# Patient Record
Sex: Male | Born: 1955 | Hispanic: No | Marital: Single | Smoking: Former smoker
Health system: Southern US, Community
[De-identification: ages and names within clinical notes are randomized; demographics above are authoritative.]

## PROBLEM LIST (undated history)

## (undated) DIAGNOSIS — H3530 Unspecified macular degeneration: Secondary | ICD-10-CM

## (undated) HISTORY — PX: HERNIA REPAIR: SHX51

---

## 2004-02-17 ENCOUNTER — Ambulatory Visit: Payer: Self-pay | Admitting: Internal Medicine

## 2004-02-27 ENCOUNTER — Ambulatory Visit: Payer: Self-pay | Admitting: Internal Medicine

## 2010-05-28 ENCOUNTER — Other Ambulatory Visit: Payer: Self-pay | Admitting: Internal Medicine

## 2010-05-28 ENCOUNTER — Encounter: Payer: Self-pay | Admitting: Internal Medicine

## 2010-05-28 ENCOUNTER — Ambulatory Visit (INDEPENDENT_AMBULATORY_CARE_PROVIDER_SITE_OTHER): Payer: Self-pay | Admitting: Internal Medicine

## 2010-05-28 ENCOUNTER — Other Ambulatory Visit: Payer: Self-pay

## 2010-05-28 DIAGNOSIS — Z0000 Encounter for general adult medical examination without abnormal findings: Secondary | ICD-10-CM

## 2010-05-28 DIAGNOSIS — J309 Allergic rhinitis, unspecified: Secondary | ICD-10-CM | POA: Insufficient documentation

## 2010-05-28 LAB — LIPID PANEL
Total CHOL/HDL Ratio: 2
VLDL: 10.2 mg/dL (ref 0.0–40.0)

## 2010-05-28 LAB — BASIC METABOLIC PANEL
BUN: 16 mg/dL (ref 6–23)
GFR: 76.81 mL/min (ref >60.00)
Glucose, Bld: 81 mg/dL (ref 70–99)
Potassium: 3.7 mEq/L (ref 3.5–5.1)

## 2010-05-31 ENCOUNTER — Ambulatory Visit: Payer: Self-pay | Admitting: Internal Medicine

## 2010-06-07 NOTE — Assessment & Plan Note (Signed)
Summary: new pt/cpx/per Dr. Yetta Barre no charge for exam/lb   Vital Signs:  Patient profile:   55 year old male Height:      74 inches Weight:      184 pounds BMI:     23.71 O2 Sat:      99 % on Room air Temp:     97.5 degrees F oral Pulse rate:   75 / minute Pulse rhythm:   regular Resp:     16 per minute BP sitting:   128 / 78  (left arm) Cuff size:   regular  Vitals Entered By: Bill Salinas CMA (May 28, 2010 3:01 PM)  O2 Flow:  Room air CC: New pt here to est care with primary/ ab, Preventive Care Is Patient Diabetic? No Pain Assessment Patient in pain? no        Primary Care Provider:  Yetta Barre  CC:  New pt here to est care with primary/ ab and Preventive Care.  History of Present Illness: New to me for a complete physical. He feels well and offers no complaints.  Preventive Screening-Counseling & Management  Alcohol-Tobacco     Alcohol drinks/day: <1     Alcohol type: spirits     >5/day in last 3 mos: no     Alcohol Counseling: not indicated; use of alcohol is not excessive or problematic     Feels need to cut down: no     Feels annoyed by complaints: no     Feels guilty re: drinking: no     Needs 'eye opener' in am: no     Smoking Status: quit > 6 months     Smoking Cessation Counseling: no     Smoke Cessation Stage: quit     Packs/Day: 0.25     Year Started: 1995     Year Quit: 2011     Pack years: 3     Tobacco Counseling: to remain off tobacco products  Hep-HIV-STD-Contraception     Hepatitis Risk: no risk noted     HIV Risk: no risk noted     STD Risk: no risk noted     Dental Visit-last 6 months yes     Dental Care Counseling: to seek dental care; no dental care within six months     TSE monthly: yes     Testicular SE Education/Counseling to perform regular STE     Sun Exposure-Excessive: no     Sun Exposure Counseling: to decrease sun exposure  Safety-Violence-Falls     Seat Belt Use: no     Helmet Use: n/a     Firearms in the Home: no  firearms in the home     Smoke Detectors: yes     Violence in the Home: no risk noted     Sexual Abuse: no      Sexual History:  not active.        Drug Use:  never.        Blood Transfusions:  no.    Current Medications (verified): 1)  None  Allergies (verified): No Known Drug Allergies  Past History:  Past Medical History: Allergic rhinitis  Family History: Family History of Dementia Family History Hypertension  Social History: Occupation: aesthestician Single Former Smoker Alcohol use-yes Regular exercise-yes  Smoking Status:  quit > 6 months Hepatitis Risk:  no risk noted HIV Risk:  no risk noted STD Risk:  no risk noted Drug Use:  never Blood Transfusions:  no Sexual History:  not active  Packs/Day:  0.25 Dental Care w/in 6 mos.:  yes Sun Exposure-Excessive:  no Seat Belt Use:  no  Review of Systems  The patient denies anorexia, fever, weight loss, weight gain, chest pain, syncope, dyspnea on exertion, peripheral edema, prolonged cough, headaches, hemoptysis, abdominal pain, melena, hematochezia, severe indigestion/heartburn, hematuria, muscle weakness, suspicious skin lesions, transient blindness, difficulty walking, depression, unusual weight change, abnormal bleeding, enlarged lymph nodes, angioedema, and testicular masses.   General:  Denies chills, fatigue, fever, loss of appetite, malaise, sleep disorder, sweats, weakness, and weight loss. GU:  Denies decreased libido, discharge, dysuria, erectile dysfunction, genital sores, hematuria, incontinence, nocturia, urinary frequency, and urinary hesitancy. Derm:  Denies dryness, itching, lesion(s), poor wound healing, and rash.  Physical Exam  General:  alert, well-developed, well-nourished, well-hydrated, appropriate dress, normal appearance, healthy-appearing, cooperative to examination, and good hygiene.   Head:  normocephalic, atraumatic, no abnormalities observed, no abnormalities palpated, and no  alopecia.   Eyes:  vision grossly intact, pupils equal, pupils round, and pupils reactive to light.   Ears:  R ear normal and L ear normal.   Nose:  External nasal examination shows no deformity or inflammation. Nasal mucosa are pink and moist without lesions or exudates. Mouth:  Oral mucosa and oropharynx without lesions or exudates.  Teeth in good repair. Neck:  supple, full ROM, no masses, no thyromegaly, no thyroid nodules or tenderness, no JVD, no HJR, normal carotid upstroke, no carotid bruits, and no cervical lymphadenopathy.   Chest Wall:  no deformities, no tenderness, and no masses.   Breasts:  No masses or gynecomastia noted Lungs:  Normal respiratory effort, chest expands symmetrically. Lungs are clear to auscultation, no crackles or wheezes. Heart:  Normal rate and regular rhythm. S1 and S2 normal without gallop, murmur, click, rub or other extra sounds. Abdomen:  Bowel sounds positive,abdomen soft and non-tender without masses, organomegaly or hernias noted. Rectal:  no external abnormalities, no hemorrhoids, normal sphincter tone, no masses, no tenderness, no fissures, no fistulae, and no perianal rash.   Genitalia:  circumcised, no hydrocele, no varicocele, no scrotal masses, no testicular masses or atrophy, no cutaneous lesions, and no urethral discharge.   Prostate:  no gland enlargement, no nodules, no asymmetry, and no induration.   Msk:  No deformity or scoliosis noted of thoracic or lumbar spine.   Pulses:  R and L carotid,radial,femoral,dorsalis pedis and posterior tibial pulses are full and equal bilaterally Extremities:  No clubbing, cyanosis, edema, or deformity noted with normal full range of motion of all joints.   Neurologic:  No cranial nerve deficits noted. Station and gait are normal. Plantar reflexes are down-going bilaterally. DTRs are symmetrical throughout. Sensory, motor and coordinative functions appear intact. Skin:  turgor normal, color normal, no rashes, no  suspicious lesions, no ecchymoses, no petechiae, no purpura, no ulcerations, no edema. Cervical Nodes:  no anterior cervical adenopathy and no posterior cervical adenopathy.   Axillary Nodes:  no R axillary adenopathy and no L axillary adenopathy.   Inguinal Nodes:  no R inguinal adenopathy and no L inguinal adenopathy.   Psych:  Cognition and judgment appear intact. Alert and cooperative with normal attention span and concentration. No apparent delusions, illusions, hallucinations   Impression & Recommendations:  Problem # 1:  ROUTINE GENERAL MEDICAL EXAM@HEALTH  CARE FACL (ICD-V70.0)  Orders: Venipuncture (78295) TLB-BMP (Basic Metabolic Panel-BMET) (80048-METABOL) TLB-Lipid Panel (80061-LIPID) No Charge Patient Arrived (NCPA0) (NCPA0)  Td Booster: Historical (11/15/2002)    Discussed using sunscreen, use of alcohol, drug use,  self testicular exam, routine dental care, routine eye care, routine physical exam, seat belts, multiple vitamins,  and recommendations for immunizations.  Discussed exercise and checking cholesterol.   Problem # 2:  ALLERGIC RHINITIS (ICD-477.9) Assessment: Improved  The following medications were removed from the medication list:    Fluticasone Propionate 50 Mcg/act Susp (Fluticasone propionate) ..... Inhale 1 puff as directed  once a day  Colorectal Screening:  Current Recommendations:    Hemoccult: NEG X 1 today  Immunization & Chemoprophylaxis:    Tetanus vaccine: Historical  (11/15/2002)  Patient Instructions: 1)  It is important that you exercise regularly at least 20 minutes 5 times a week. If you develop chest pain, have severe difficulty breathing, or feel very tired , stop exercising immediately and seek medical attention. 2)  Schedule a colonoscopy/sigmoidoscopy to help detect colon cancer. 3)  Take an Aspirin every day. 4)  Please schedule a follow-up appointment as needed.   Orders Added: 1)  Venipuncture [36415] 2)  TLB-BMP (Basic  Metabolic Panel-BMET) [80048-METABOL] 3)  TLB-Lipid Panel [80061-LIPID] 4)  No Charge Patient Arrived (NCPA0) [NCPA0]

## 2012-12-18 ENCOUNTER — Other Ambulatory Visit (INDEPENDENT_AMBULATORY_CARE_PROVIDER_SITE_OTHER): Payer: BC Managed Care – PPO

## 2012-12-18 ENCOUNTER — Ambulatory Visit (INDEPENDENT_AMBULATORY_CARE_PROVIDER_SITE_OTHER): Payer: BC Managed Care – PPO | Admitting: Internal Medicine

## 2012-12-18 ENCOUNTER — Encounter: Payer: Self-pay | Admitting: Internal Medicine

## 2012-12-18 VITALS — BP 142/90 | HR 76 | Temp 97.5°F | Resp 16 | Ht 73.0 in | Wt 194.0 lb

## 2012-12-18 DIAGNOSIS — Z0000 Encounter for general adult medical examination without abnormal findings: Secondary | ICD-10-CM | POA: Insufficient documentation

## 2012-12-18 DIAGNOSIS — J309 Allergic rhinitis, unspecified: Secondary | ICD-10-CM

## 2012-12-18 LAB — COMPREHENSIVE METABOLIC PANEL
AST: 14 U/L (ref 0–37)
BUN: 21 mg/dL (ref 6–23)
Calcium: 9.4 mg/dL (ref 8.4–10.5)
Chloride: 103 mEq/L (ref 96–112)
Creatinine, Ser: 1.2 mg/dL (ref 0.4–1.5)
GFR: 68.63 mL/min (ref >60.00)

## 2012-12-18 LAB — LIPID PANEL: Cholesterol: 187 mg/dL (ref 0–200)

## 2012-12-18 LAB — URINALYSIS, ROUTINE W REFLEX MICROSCOPIC
Bilirubin Urine: NEGATIVE
Hgb urine dipstick: NEGATIVE
Leukocytes, UA: NEGATIVE
Nitrite: NEGATIVE
pH: 6 (ref 5.0–8.0)

## 2012-12-18 LAB — CBC WITH DIFFERENTIAL/PLATELET
Basophils Relative: 0.4 % (ref 0.0–3.0)
Eosinophils Absolute: 0.1 10*3/uL (ref 0.0–0.7)
HCT: 43 % (ref 39.0–52.0)
Hemoglobin: 14.9 g/dL (ref 13.0–17.0)
Lymphocytes Relative: 31.4 % (ref 12.0–46.0)
MCHC: 34.7 g/dL (ref 30.0–36.0)
MCV: 91.3 fl (ref 78.0–100.0)
Monocytes Absolute: 0.3 10*3/uL (ref 0.1–1.0)
Neutro Abs: 3.1 10*3/uL (ref 1.4–7.7)
RBC: 4.71 Mil/uL (ref 4.22–5.81)

## 2012-12-18 LAB — TSH: TSH: 1.16 u[IU]/mL (ref 0.35–5.50)

## 2012-12-18 NOTE — Patient Instructions (Addendum)
Health Maintenance, Males A healthy lifestyle and preventative care can promote health and wellness.  Maintain regular health, dental, and eye exams.  Eat a healthy diet. Foods like vegetables, fruits, whole grains, low-fat dairy products, and lean protein foods contain the nutrients you need without too many calories. Decrease your intake of foods high in solid fats, added sugars, and salt. Get information about a proper diet from your caregiver, if necessary.  Regular physical exercise is one of the most important things you can do for your health. Most adults should get at least 150 minutes of moderate-intensity exercise (any activity that increases your heart rate and causes you to sweat) each week. In addition, most adults need muscle-strengthening exercises on 2 or more days a week.   Maintain a healthy weight. The body mass index (BMI) is a screening tool to identify possible weight problems. It provides an estimate of body fat based on height and weight. Your caregiver can help determine your BMI, and can help you achieve or maintain a healthy weight. For adults 20 years and older:  A BMI below 18.5 is considered underweight.  A BMI of 18.5 to 24.9 is normal.  A BMI of 25 to 29.9 is considered overweight.  A BMI of 30 and above is considered obese.  Maintain normal blood lipids and cholesterol by exercising and minimizing your intake of saturated fat. Eat a balanced diet with plenty of fruits and vegetables. Blood tests for lipids and cholesterol should begin at age 20 and be repeated every 5 years. If your lipid or cholesterol levels are high, you are over 50, or you are a high risk for heart disease, you may need your cholesterol levels checked more frequently.Ongoing high lipid and cholesterol levels should be treated with medicines, if diet and exercise are not effective.  If you smoke, find out from your caregiver how to quit. If you do not use tobacco, do not start.  If you  choose to drink alcohol, do not exceed 2 drinks per day. One drink is considered to be 12 ounces (355 mL) of beer, 5 ounces (148 mL) of wine, or 1.5 ounces (44 mL) of liquor.  Avoid use of street drugs. Do not share needles with anyone. Ask for help if you need support or instructions about stopping the use of drugs.  High blood pressure causes heart disease and increases the risk of stroke. Blood pressure should be checked at least every 1 to 2 years. Ongoing high blood pressure should be treated with medicines if weight loss and exercise are not effective.  If you are 45 to 55 years old, ask your caregiver if you should take aspirin to prevent heart disease.  Diabetes screening involves taking a blood sample to check your fasting blood sugar level. This should be done once every 3 years, after age 45, if you are within normal weight and without risk factors for diabetes. Testing should be considered at a younger age or be carried out more frequently if you are overweight and have at least 1 risk factor for diabetes.  Colorectal cancer can be detected and often prevented. Most routine colorectal cancer screening begins at the age of 50 and continues through age 75. However, your caregiver may recommend screening at an earlier age if you have risk factors for colon cancer. On a yearly basis, your caregiver may provide home test kits to check for hidden blood in the stool. Use of a small camera at the end of a tube,   to directly examine the colon (sigmoidoscopy or colonoscopy), can detect the earliest forms of colorectal cancer. Talk to your caregiver about this at age 50, when routine screening begins. Direct examination of the colon should be repeated every 5 to 10 years through age 75, unless early forms of pre-cancerous polyps or small growths are found.  Hepatitis C blood testing is recommended for all people born from 1945 through 1965 and any individual with known risks for hepatitis C.  Healthy  men should no longer receive prostate-specific antigen (PSA) blood tests as part of routine cancer screening. Consult with your caregiver about prostate cancer screening.  Testicular cancer screening is not recommended for adolescents or adult males who have no symptoms. Screening includes self-exam, caregiver exam, and other screening tests. Consult with your caregiver about any symptoms you have or any concerns you have about testicular cancer.  Practice safe sex. Use condoms and avoid high-risk sexual practices to reduce the spread of sexually transmitted infections (STIs).  Use sunscreen with a sun protection factor (SPF) of 30 or greater. Apply sunscreen liberally and repeatedly throughout the day. You should seek shade when your shadow is shorter than you. Protect yourself by wearing long sleeves, pants, a wide-brimmed hat, and sunglasses year round, whenever you are outdoors.  Notify your caregiver of new moles or changes in moles, especially if there is a change in shape or color. Also notify your caregiver if a mole is larger than the size of a pencil eraser.  A one-time screening for abdominal aortic aneurysm (AAA) and surgical repair of large AAAs by sound wave imaging (ultrasonography) is recommended for ages 65 to 75 years who are current or former smokers.  Stay current with your immunizations. Document Released: 09/14/2007 Document Revised: 06/10/2011 Document Reviewed: 08/13/2010 [employer] Patient Information 2014 [employer].  

## 2012-12-18 NOTE — Assessment & Plan Note (Signed)
Exam done He refused vaccines today Labs ordered He was referred for a colonoscopy Pt ed material was given

## 2012-12-18 NOTE — Progress Notes (Signed)
  Subjective:    Patient ID: Jake [name], male    DOB: [birth year], 57 y.o.   MRN: [id number]  HPI  He returns for a complete physical - he tells me that he feels well and he offers no complaints.  Review of Systems  Constitutional: Negative.  Negative for fever, chills, diaphoresis, appetite change and fatigue.  HENT: Negative.   Eyes: Negative.   Respiratory: Negative.  Negative for apnea, cough, choking, chest tightness, shortness of breath, wheezing and stridor.   Cardiovascular: Negative.  Negative for chest pain, palpitations and leg swelling.  Gastrointestinal: Negative.  Negative for nausea, vomiting, abdominal pain, diarrhea and constipation.  Endocrine: Negative.   Genitourinary: Negative.   Musculoskeletal: Negative.   Skin: Negative.   Allergic/Immunologic: Negative.   Neurological: Negative.   Hematological: Negative.  Negative for adenopathy. Does not bruise/bleed easily.  Psychiatric/Behavioral: Negative.        Objective:   Physical Exam  Vitals reviewed. Constitutional: He is oriented to person, place, and time. He appears well-developed and well-nourished. No distress.  HENT:  Head: Normocephalic and atraumatic.  Mouth/Throat: Oropharynx is clear and moist. No oropharyngeal exudate.  Eyes: Conjunctivae are normal. Right eye exhibits no discharge. Left eye exhibits no discharge. No scleral icterus.  Neck: Normal range of motion. Neck supple. No JVD present. No tracheal deviation present. No thyromegaly present.  Cardiovascular: Normal rate, regular rhythm, normal heart sounds and intact distal pulses.  Exam reveals no gallop and no friction rub.   No murmur heard. Pulmonary/Chest: Effort normal and breath sounds normal. No stridor. No respiratory distress. He has no wheezes. He has no rales. He exhibits no tenderness.  Abdominal: Soft. Bowel sounds are normal. He exhibits no distension and no mass. There is no tenderness. There is no rebound and no guarding.  Hernia confirmed negative in the right inguinal area and confirmed negative in the left inguinal area.  Genitourinary: Rectum normal, prostate normal, testes normal and penis normal. Rectal exam shows no external hemorrhoid, no internal hemorrhoid, no fissure, no mass, no tenderness and anal tone normal. Guaiac negative stool. Prostate is not enlarged and not tender. Right testis shows no mass, no swelling and no tenderness. Right testis is descended. Left testis shows no mass, no swelling and no tenderness. Left testis is descended. Circumcised. No penile erythema or penile tenderness. No discharge found.  Musculoskeletal: Normal range of motion. He exhibits no edema and no tenderness.  Lymphadenopathy:    He has no cervical adenopathy.       Right: No inguinal adenopathy present.       Left: No inguinal adenopathy present.  Neurological: He is oriented to person, place, and time.  Skin: Skin is warm and dry. No rash noted. He is not diaphoretic. No erythema. No pallor.  Psychiatric: He has a normal mood and affect. His behavior is normal. Judgment and thought content normal.     Lab Results  Component Value Date   GLUCOSE 81 05/28/2010   CHOL 181 05/28/2010   TRIG 51.0 05/28/2010   HDL 95.80 05/28/2010   LDLCALC 75 05/28/2010   NA 142 05/28/2010   K 3.7 05/28/2010   CL 107 05/28/2010   CREATININE 1.1 05/28/2010   BUN 16 05/28/2010   CO2 27 05/28/2010       Assessment & Plan:

## 2012-12-20 ENCOUNTER — Encounter: Payer: Self-pay | Admitting: Internal Medicine

## 2013-02-04 ENCOUNTER — Other Ambulatory Visit: Payer: Self-pay

## 2013-12-01 ENCOUNTER — Encounter: Payer: Self-pay | Admitting: Internal Medicine

## 2013-12-01 ENCOUNTER — Ambulatory Visit (INDEPENDENT_AMBULATORY_CARE_PROVIDER_SITE_OTHER): Payer: BC Managed Care – PPO | Admitting: Internal Medicine

## 2013-12-01 VITALS — BP 160/100 | HR 77 | Temp 98.7°F | Resp 16 | Ht 73.0 in | Wt 192.4 lb

## 2013-12-01 DIAGNOSIS — J309 Allergic rhinitis, unspecified: Secondary | ICD-10-CM

## 2013-12-01 DIAGNOSIS — H1013 Acute atopic conjunctivitis, bilateral: Secondary | ICD-10-CM

## 2013-12-01 DIAGNOSIS — H1010 Acute atopic conjunctivitis, unspecified eye: Secondary | ICD-10-CM

## 2013-12-01 DIAGNOSIS — I10 Essential (primary) hypertension: Secondary | ICD-10-CM

## 2013-12-01 MED ORDER — FLUTICASONE PROPIONATE 50 MCG/ACT NA SUSP: 2.0000 | Freq: Every day | NASAL | Status: DC

## 2013-12-01 MED ORDER — TOBRAMYCIN-DEXAMETHASONE 0.3-0.05 % OP SUSP: 1.0000 [drp] | Freq: Four times a day (QID) | OPHTHALMIC | Status: DC

## 2013-12-01 MED ORDER — METHYLPREDNISOLONE ACETATE 80 MG/ML IJ SUSP
120.0000 mg | Freq: Once | INTRAMUSCULAR | Status: AC
  Administered 2013-12-01: 120 mg via INTRAMUSCULAR

## 2013-12-01 MED ORDER — BECLOMETHASONE DIPROPIONATE 80 MCG/ACT NA AERS: 4.0000 | INHALATION_SPRAY | Freq: Every day | NASAL | Status: DC

## 2013-12-01 NOTE — Progress Notes (Signed)
Subjective:    Patient ID: Jake [name], male    DOB: 06/27/1955, 58 y.o.   MRN: [id number]  Allergic Reaction This is a new problem. The current episode started 5 to 7 days ago. The problem occurs constantly. The problem is unchanged. The problem is moderate. It is unknown what he was exposed to. Associated symptoms include eye itching, eye redness and eye watering. Pertinent negatives include no abdominal pain, chest pain, chest pressure, coughing, diarrhea, difficulty breathing, drooling, globus sensation, hyperventilation, itching, rash, stridor, trouble swallowing, vomiting or wheezing. Treatments tried: zyrtec. The treatment provided mild relief. His past medical history is significant for seasonal allergies. There is no history of asthma, atopic dermatitis, food allergies or medication allergies.      Review of Systems  Constitutional: Negative.  Negative for fever, chills, diaphoresis, appetite change and fatigue.  HENT: Positive for congestion, ear pain ("popping in his left ear"), postnasal drip, rhinorrhea and sneezing. Negative for drooling, facial swelling, nosebleeds, sinus pressure, sore throat, tinnitus, trouble swallowing and voice change.   Eyes: Positive for redness and itching.  Respiratory: Negative.  Negative for apnea, cough, choking, chest tightness, shortness of breath, wheezing and stridor.   Cardiovascular: Negative.  Negative for chest pain, palpitations and leg swelling.  Gastrointestinal: Negative.  Negative for vomiting, abdominal pain, diarrhea, constipation and blood in stool.  Endocrine: Negative.   Genitourinary: Negative.  Negative for dysuria and hematuria.  Musculoskeletal: Negative.   Skin: Negative.  Negative for itching and rash.  Allergic/Immunologic: Negative.  Negative for food allergies.  Neurological: Positive for dizziness.  Hematological: Negative.  Negative for adenopathy. Does not bruise/bleed easily.  Psychiatric/Behavioral: Negative.         Objective:   Physical Exam  Vitals reviewed. Constitutional: He is oriented to person, place, and time. He appears well-developed and well-nourished.  Non-toxic appearance. He does not have a sickly appearance. He does not appear ill. No distress.  HENT:  Head: Normocephalic and atraumatic.  Right Ear: Hearing, tympanic membrane, external ear and ear canal normal.  Left Ear: Hearing, external ear and ear canal normal. Tympanic membrane is not injected, not perforated, not erythematous, not retracted and not bulging. A middle ear effusion (serous) is present.  Nose: Mucosal edema and rhinorrhea present. No nose lacerations, sinus tenderness, nasal deformity or nasal septal hematoma. No epistaxis.  No foreign bodies. Right sinus exhibits no maxillary sinus tenderness and no frontal sinus tenderness. Left sinus exhibits no maxillary sinus tenderness and no frontal sinus tenderness.  Mouth/Throat: Oropharynx is clear and moist and mucous membranes are normal. Mucous membranes are not pale, not dry and not cyanotic. No oral lesions. No trismus in the jaw. No uvula swelling. No oropharyngeal exudate, posterior oropharyngeal edema, posterior oropharyngeal erythema or tonsillar abscesses.  Eyes: EOM are normal. Pupils are equal, round, and reactive to light. Right eye exhibits chemosis. Right eye exhibits no discharge, no exudate and no hordeolum. No foreign body present in the right eye. Left eye exhibits no chemosis, no discharge, no exudate and no hordeolum. No foreign body present in the left eye. Right conjunctiva is injected. Right conjunctiva has no hemorrhage. Left conjunctiva is injected. Left conjunctiva has no hemorrhage. No scleral icterus. Right eye exhibits no nystagmus. Left eye exhibits normal extraocular motion and no nystagmus. Right pupil is round and reactive. Left pupil is round and reactive. Pupils are equal.  Neck: Normal range of motion. Neck supple. No JVD present. No tracheal  deviation present. No thyromegaly present.  Cardiovascular: Normal rate, regular rhythm, normal heart sounds and intact distal pulses.  Exam reveals no gallop and no friction rub.   No murmur heard. Pulmonary/Chest: Effort normal and breath sounds normal. No stridor. No respiratory distress. He has no wheezes. He has no rales. He exhibits no tenderness.  Abdominal: Soft. Bowel sounds are normal. He exhibits no distension and no mass. There is no tenderness. There is no rebound and no guarding.  Musculoskeletal: Normal range of motion. He exhibits no edema and no tenderness.  Lymphadenopathy:    He has no cervical adenopathy.  Neurological: He is oriented to person, place, and time.  Skin: Skin is warm and dry. No rash noted. He is not diaphoretic. No erythema. No pallor.  Psychiatric: He has a normal mood and affect. His behavior is normal. Judgment and thought content normal.     Lab Results  Component Value Date   WBC 5.1 12/18/2012   HGB 14.9 12/18/2012   HCT 43.0 12/18/2012   PLT 138.0* 12/18/2012   GLUCOSE 85 12/18/2012   CHOL 187 12/18/2012   TRIG 49.0 12/18/2012   HDL 86.10 12/18/2012   LDLCALC 91 12/18/2012   ALT 20 12/18/2012   AST 14 12/18/2012   NA 139 12/18/2012   K 4.3 12/18/2012   CL 103 12/18/2012   CREATININE 1.2 12/18/2012   BUN 21 12/18/2012   CO2 30 12/18/2012   TSH 1.16 12/18/2012   PSA 1.25 12/18/2012       Assessment & Plan:

## 2013-12-01 NOTE — Progress Notes (Signed)
Pre visit review using our clinic review tool, if applicable. No additional management support is needed unless otherwise documented below in the visit note. 

## 2013-12-01 NOTE — Patient Instructions (Signed)

## 2013-12-02 ENCOUNTER — Encounter: Payer: Self-pay | Admitting: Internal Medicine

## 2013-12-02 NOTE — Assessment & Plan Note (Signed)
He refused to start an antihypertensive today He agrees to quit smoking and to work on his lifestyle modifications Will recheck his BP in 3-4 weeks

## 2013-12-02 NOTE — Assessment & Plan Note (Signed)
Will treat with tobradex susp

## 2013-12-02 NOTE — Assessment & Plan Note (Signed)
He has some eustachian tube dysfunction so I gave him an injection of depo-medrol IM Will also start nasal steroids Will cont zyrtec

## 2013-12-28 ENCOUNTER — Ambulatory Visit: Payer: BC Managed Care – PPO | Admitting: Internal Medicine

## 2014-01-06 ENCOUNTER — Encounter: Payer: Self-pay | Admitting: Internal Medicine

## 2014-01-06 ENCOUNTER — Ambulatory Visit (INDEPENDENT_AMBULATORY_CARE_PROVIDER_SITE_OTHER): Payer: BC Managed Care – PPO | Admitting: Internal Medicine

## 2014-01-06 VITALS — BP 130/86 | HR 82 | Temp 98.3°F | Resp 16 | Ht 73.0 in | Wt 192.0 lb

## 2014-01-06 DIAGNOSIS — I10 Essential (primary) hypertension: Secondary | ICD-10-CM

## 2014-01-06 NOTE — Progress Notes (Signed)
   Subjective:    Patient ID: Jake [name], male    DOB: 1955-11-04, 58 y.o.   MRN: [id number]  Hypertension This is a recurrent problem. The current episode started more than 1 month ago. The problem has been gradually improving since onset. The problem is controlled. Pertinent negatives include no anxiety, blurred vision, chest pain, headaches, malaise/fatigue, neck pain, orthopnea, palpitations, peripheral edema, PND, shortness of breath or sweats. There are no associated agents to hypertension. Past treatments include nothing. The current treatment provides moderate improvement. There are no compliance problems.       Review of Systems  Constitutional: Negative for malaise/fatigue.  Eyes: Negative for blurred vision.  Respiratory: Negative for shortness of breath.   Cardiovascular: Negative for chest pain, palpitations, orthopnea and PND.  Musculoskeletal: Negative for neck pain.  Neurological: Negative for headaches.  All other systems reviewed and are negative.      Objective:   Physical Exam  Vitals reviewed. Constitutional: He is oriented to person, place, and time. He appears well-developed and well-nourished. No distress.  HENT:  Head: Normocephalic and atraumatic.  Mouth/Throat: Oropharynx is clear and moist. No oropharyngeal exudate.  Eyes: Conjunctivae are normal. Right eye exhibits no discharge. Left eye exhibits no discharge. No scleral icterus.  Neck: Normal range of motion. Neck supple. No JVD present. No tracheal deviation present. No thyromegaly present.  Cardiovascular: Normal rate, regular rhythm, normal heart sounds and intact distal pulses.  Exam reveals no gallop and no friction rub.   No murmur heard. Pulmonary/Chest: Effort normal and breath sounds normal. No stridor. No respiratory distress. He has no wheezes. He has no rales. He exhibits no tenderness.  Abdominal: Soft. Bowel sounds are normal. He exhibits no distension and no mass. There is no  tenderness. There is no rebound and no guarding.  Musculoskeletal: Normal range of motion. He exhibits no edema and no tenderness.  Lymphadenopathy:    He has no cervical adenopathy.  Neurological: He is oriented to person, place, and time.  Skin: Skin is warm and dry. No rash noted. He is not diaphoretic. No erythema. No pallor.     Lab Results  Component Value Date   WBC 5.1 12/18/2012   HGB 14.9 12/18/2012   HCT 43.0 12/18/2012   PLT 138.0* 12/18/2012   GLUCOSE 85 12/18/2012   CHOL 187 12/18/2012   TRIG 49.0 12/18/2012   HDL 86.10 12/18/2012   LDLCALC 91 12/18/2012   ALT 20 12/18/2012   AST 14 12/18/2012   NA 139 12/18/2012   K 4.3 12/18/2012   CL 103 12/18/2012   CREATININE 1.2 12/18/2012   BUN 21 12/18/2012   CO2 30 12/18/2012   TSH 1.16 12/18/2012   PSA 1.25 12/18/2012       Assessment & Plan:

## 2014-01-06 NOTE — Assessment & Plan Note (Signed)
His BP is well controlled 

## 2014-01-06 NOTE — Progress Notes (Signed)
Pre visit review using our clinic review tool, if applicable. No additional management support is needed unless otherwise documented below in the visit note. 

## 2014-01-06 NOTE — Patient Instructions (Signed)

## 2014-01-07 ENCOUNTER — Telehealth: Payer: Self-pay | Admitting: Internal Medicine

## 2014-01-07 NOTE — Telephone Encounter (Signed)
emmi emailed °

## 2014-02-08 ENCOUNTER — Ambulatory Visit (INDEPENDENT_AMBULATORY_CARE_PROVIDER_SITE_OTHER): Payer: BC Managed Care – PPO | Admitting: Internal Medicine

## 2014-02-08 ENCOUNTER — Encounter: Payer: Self-pay | Admitting: Internal Medicine

## 2014-02-08 ENCOUNTER — Other Ambulatory Visit (INDEPENDENT_AMBULATORY_CARE_PROVIDER_SITE_OTHER): Payer: BC Managed Care – PPO

## 2014-02-08 VITALS — BP 136/88 | HR 94 | Resp 16 | Ht 73.0 in | Wt 190.0 lb

## 2014-02-08 DIAGNOSIS — Z0000 Encounter for general adult medical examination without abnormal findings: Secondary | ICD-10-CM

## 2014-02-08 DIAGNOSIS — Z23 Encounter for immunization: Secondary | ICD-10-CM

## 2014-02-08 LAB — COMPREHENSIVE METABOLIC PANEL
ALBUMIN: 3.7 g/dL (ref 3.5–5.2)
ALT: 21 U/L (ref 0–53)
AST: 17 U/L (ref 0–37)
Alkaline Phosphatase: 76 U/L (ref 39–117)
BILIRUBIN TOTAL: 1.1 mg/dL (ref 0.2–1.2)
BUN: 17 mg/dL (ref 6–23)
CO2: 22 meq/L (ref 19–32)
Calcium: 9.3 mg/dL (ref 8.4–10.5)
Chloride: 107 mEq/L (ref 96–112)
Creatinine, Ser: 1.1 mg/dL (ref 0.4–1.5)
GFR: 74.17 mL/min (ref >60.00)
GLUCOSE: 105 mg/dL — AB (ref 70–99)
Potassium: 3.9 mEq/L (ref 3.5–5.1)
SODIUM: 142 meq/L (ref 135–145)
TOTAL PROTEIN: 6.9 g/dL (ref 6.0–8.3)

## 2014-02-08 LAB — CBC WITH DIFFERENTIAL/PLATELET
BASOS ABS: 0 10*3/uL (ref 0.0–0.1)
Basophils Relative: 0.3 % (ref 0.0–3.0)
Eosinophils Absolute: 0.1 10*3/uL (ref 0.0–0.7)
Eosinophils Relative: 1.3 % (ref 0.0–5.0)
HEMATOCRIT: 43.8 % (ref 39.0–52.0)
Hemoglobin: 14.7 g/dL (ref 13.0–17.0)
Lymphocytes Relative: 24.4 % (ref 12.0–46.0)
Lymphs Abs: 1.2 10*3/uL (ref 0.7–4.0)
MCHC: 33.5 g/dL (ref 30.0–36.0)
MCV: 94.7 fl (ref 78.0–100.0)
MONOS PCT: 8.1 % (ref 3.0–12.0)
Monocytes Absolute: 0.4 10*3/uL (ref 0.1–1.0)
Neutro Abs: 3.3 10*3/uL (ref 1.4–7.7)
Neutrophils Relative %: 65.9 % (ref 43.0–77.0)
PLATELETS: 147 10*3/uL — AB (ref 150.0–400.0)
RBC: 4.62 Mil/uL (ref 4.22–5.81)
RDW: 12.9 % (ref 11.5–15.5)
WBC: 5 10*3/uL (ref 4.0–10.5)

## 2014-02-08 LAB — URINALYSIS, ROUTINE W REFLEX MICROSCOPIC
HGB URINE DIPSTICK: NEGATIVE
Ketones, ur: 15 — AB
LEUKOCYTES UA: NEGATIVE
Nitrite: NEGATIVE
RBC / HPF: NONE SEEN (ref 0–?)
SPECIFIC GRAVITY, URINE: 1.025 (ref 1.000–1.030)
Total Protein, Urine: NEGATIVE
URINE GLUCOSE: NEGATIVE
UROBILINOGEN UA: 1 (ref 0.0–1.0)
pH: 6 (ref 5.0–8.0)

## 2014-02-08 LAB — LIPID PANEL
CHOLESTEROL: 192 mg/dL (ref 0–200)
HDL: 89.6 mg/dL (ref >39.00)
LDL Cholesterol: 92 mg/dL (ref 0–99)
NonHDL: 102.4
Total CHOL/HDL Ratio: 2
Triglycerides: 52 mg/dL (ref 0.0–149.0)
VLDL: 10.4 mg/dL (ref 0.0–40.0)

## 2014-02-08 LAB — FECAL OCCULT BLOOD, GUAIAC: Fecal Occult Blood: NEGATIVE

## 2014-02-08 LAB — TSH: TSH: 0.93 u[IU]/mL (ref 0.35–4.50)

## 2014-02-08 LAB — PSA: PSA: 1.15 ng/mL (ref 0.10–4.00)

## 2014-02-08 NOTE — Patient Instructions (Signed)

## 2014-02-08 NOTE — Progress Notes (Signed)
   Subjective:    Patient ID: Jake [name], male    DOB: 1955/10/03, 58 y.o.   MRN: [id number]  HPI  He returns for a complete physical, he feels well and offers no complaints.   Review of Systems  Constitutional: Negative.  Negative for fever, chills, diaphoresis, appetite change and fatigue.  HENT: Negative.   Eyes: Negative.   Respiratory: Negative.  Negative for cough, choking, chest tightness, shortness of breath and stridor.   Cardiovascular: Negative.  Negative for chest pain, palpitations and leg swelling.  Gastrointestinal: Negative.  Negative for nausea, abdominal pain, diarrhea and blood in stool.  Endocrine: Negative.  Negative for polyuria.  Genitourinary: Negative.   Musculoskeletal: Negative.   Skin: Negative.   Allergic/Immunologic: Negative.   Neurological: Negative.  Negative for dizziness.  Hematological: Negative.  Negative for adenopathy. Does not bruise/bleed easily.  Psychiatric/Behavioral: Negative.        Objective:   Physical Exam  Constitutional: He appears well-developed and well-nourished. No distress.  HENT:  Head: Normocephalic and atraumatic.  Mouth/Throat: Oropharynx is clear and moist. No oropharyngeal exudate.  Eyes: Conjunctivae are normal. Right eye exhibits no discharge. Left eye exhibits no discharge. No scleral icterus.  Neck: Normal range of motion. Neck supple. No JVD present. No tracheal deviation present. No thyromegaly present.  Cardiovascular: Normal rate, regular rhythm, normal heart sounds and intact distal pulses.  Exam reveals no gallop and no friction rub.   No murmur heard. Pulmonary/Chest: Effort normal and breath sounds normal. No stridor. No respiratory distress. He has no wheezes. He has no rales. He exhibits no tenderness.  Abdominal: Soft. Bowel sounds are normal. He exhibits no distension and no mass. There is no tenderness. There is no rebound and no guarding. Hernia confirmed negative in the right inguinal area and  confirmed negative in the left inguinal area.  Genitourinary: Rectum normal, prostate normal, testes normal and penis normal. Rectal exam shows no external hemorrhoid, no internal hemorrhoid, no fissure, no mass, no tenderness and anal tone normal. Guaiac negative stool. Prostate is not enlarged and not tender. Right testis shows no mass, no swelling and no tenderness. Right testis is descended. Left testis shows no mass, no swelling and no tenderness. Left testis is descended. Circumcised. No penile erythema or penile tenderness. No discharge found.  Lymphadenopathy:    He has no cervical adenopathy.       Right: No inguinal adenopathy present.       Left: No inguinal adenopathy present.  Skin: He is not diaphoretic.  Vitals reviewed.    Lab Results  Component Value Date   WBC 5.0 02/08/2014   HGB 14.7 02/08/2014   HCT 43.8 02/08/2014   PLT 147.0* 02/08/2014   GLUCOSE 105* 02/08/2014   CHOL 192 02/08/2014   TRIG 52.0 02/08/2014   HDL 89.60 02/08/2014   LDLCALC 92 02/08/2014   ALT 21 02/08/2014   AST 17 02/08/2014   NA 142 02/08/2014   K 3.9 02/08/2014   CL 107 02/08/2014   CREATININE 1.1 02/08/2014   BUN 17 02/08/2014   CO2 22 02/08/2014   TSH 0.93 02/08/2014   PSA 1.15 02/08/2014       Assessment & Plan:

## 2014-02-08 NOTE — Assessment & Plan Note (Signed)
His vaccines were reviewed and updated Exam done Labs ordered He was referred for a colonoscopy Pt ed material was given

## 2014-02-08 NOTE — Progress Notes (Signed)
Pre visit review using our clinic review tool, if applicable. No additional management support is needed unless otherwise documented below in the visit note. 

## 2014-03-30 ENCOUNTER — Telehealth: Payer: Self-pay | Admitting: Internal Medicine

## 2014-03-30 ENCOUNTER — Telehealth: Payer: Self-pay | Admitting: *Deleted

## 2014-03-30 DIAGNOSIS — H353290 Exudative age-related macular degeneration, unspecified eye, stage unspecified: Secondary | ICD-10-CM

## 2014-03-30 NOTE — Telephone Encounter (Signed)
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 Pt's ins changing to [facility] 04/01/2014  Holly/Dr Sherlynn Stalls [phone]

## 2014-03-30 NOTE — Telephone Encounter (Signed)
A user error has taken place: Opened by mistake.

## 2014-04-01 DIAGNOSIS — H353290 Exudative age-related macular degeneration, unspecified eye, stage unspecified: Secondary | ICD-10-CM | POA: Insufficient documentation

## 2014-04-01 NOTE — Addendum Note (Signed)
Addended by: Janith Lima on: 04/01/2014 11:52 AM   Modules accepted: Orders

## 2014-04-05 ENCOUNTER — Telehealth: Payer: Self-pay | Admitting: Internal Medicine

## 2014-04-05 NOTE — Telephone Encounter (Signed)
[place] [address] [phone] Referral # [id number] start date 04/05/14 exp 10/04/14 good for 6 visits

## 2014-04-05 NOTE — Telephone Encounter (Signed)
Patient has appointment tomorrow for eyela injection.  Is requesting referral as soon as possible.  Diagnosis code H35.32.  Patient is seeing Dr. Sherlynn Stalls.  Is requesting a call as soon as possible.

## 2014-11-28 ENCOUNTER — Telehealth: Payer: Self-pay | Admitting: Internal Medicine

## 2014-11-28 ENCOUNTER — Encounter: Payer: Self-pay | Admitting: *Deleted

## 2014-11-28 ENCOUNTER — Ambulatory Visit (INDEPENDENT_AMBULATORY_CARE_PROVIDER_SITE_OTHER): Payer: 59

## 2014-11-28 ENCOUNTER — Ambulatory Visit (INDEPENDENT_AMBULATORY_CARE_PROVIDER_SITE_OTHER): Payer: 59 | Admitting: Internal Medicine

## 2014-11-28 ENCOUNTER — Ambulatory Visit (INDEPENDENT_AMBULATORY_CARE_PROVIDER_SITE_OTHER)
Admission: RE | Admit: 2014-11-28 | Discharge: 2014-11-28 | Disposition: A | Discharge status: Home / Self Care | Payer: 59 | Source: Ambulatory Visit | Attending: Family Medicine | Admitting: Family Medicine

## 2014-11-28 ENCOUNTER — Encounter: Payer: Self-pay | Admitting: Family Medicine

## 2014-11-28 ENCOUNTER — Ambulatory Visit (INDEPENDENT_AMBULATORY_CARE_PROVIDER_SITE_OTHER): Payer: 59 | Admitting: Family Medicine

## 2014-11-28 ENCOUNTER — Encounter: Payer: Self-pay | Admitting: Internal Medicine

## 2014-11-28 ENCOUNTER — Telehealth: Payer: Self-pay | Admitting: Family Medicine

## 2014-11-28 VITALS — BP 154/92 | HR 75 | Temp 97.8°F | Resp 18

## 2014-11-28 VITALS — BP 154/92 | HR 75 | Ht 73.0 in

## 2014-11-28 DIAGNOSIS — M79604 Pain in right leg: Secondary | ICD-10-CM | POA: Diagnosis not present

## 2014-11-28 DIAGNOSIS — M25461 Effusion, right knee: Secondary | ICD-10-CM

## 2014-11-28 DIAGNOSIS — M25561 Pain in right knee: Secondary | ICD-10-CM

## 2014-11-28 DIAGNOSIS — M25469 Effusion, unspecified knee: Secondary | ICD-10-CM | POA: Insufficient documentation

## 2014-11-28 DIAGNOSIS — I10 Essential (primary) hypertension: Secondary | ICD-10-CM

## 2014-11-28 MED ORDER — METOPROLOL TARTRATE 25 MG PO TABS: 25.0000 mg | ORAL_TABLET | Freq: Two times a day (BID) | ORAL | Status: DC

## 2014-11-28 MED ORDER — MELOXICAM 15 MG PO TABS: 15.0000 mg | ORAL_TABLET | Freq: Every day | ORAL | Status: DC

## 2014-11-28 NOTE — Progress Notes (Signed)
Pre visit review using our clinic review tool, if applicable. No additional management support is needed unless otherwise documented below in the visit note. 

## 2014-11-28 NOTE — Telephone Encounter (Signed)
Please send Mobic to [facility] on [address].

## 2014-11-28 NOTE — Progress Notes (Signed)
[name] [address], [place] [address] Phone: [phone] Subjective:    I'm seeing this patient by the request  of:  Hopper MD.   CC: Right knee injury  [id number] Jake [name] is a 59 y.o. male coming in with complaint of right knee injury. History day patient was getting out of a pool and pivoted and felt a significant amount of pain immediately. Patient was able to continue to do what he was doing but this morning he woke up and was unable to bear weight. States that it feels swollen. Most the pain on the posterior aspect of the knee. Rates the severity and 9 out of 10. Denies any radiation down the legs or any numbness or tingling. Patient has never injured this knee previously.    No past medical history on file. No past surgical history on file. Social History  Substance Use Topics  . Smoking status: Light Tobacco Smoker -- 0.25 packs/day for 25 years    Types: Cigarettes  . Smokeless tobacco: Never Used  . Alcohol Use: 3.0 oz/week    5 Shots of liquor per week   No Known Allergies Family History  Problem Relation Age of Onset  . Heart disease Mother   . Hypertension Brother   . Early death Neg Hx   . Cancer Neg Hx   . Alcohol abuse Neg Hx   . Hyperlipidemia Neg Hx   . Stroke Neg Hx   . Kidney disease Neg Hx   . COPD Neg Hx   . Depression Neg Hx   . Diabetes Neg Hx   . Drug abuse Neg Hx     Past medical history, social, surgical and family history all reviewed in electronic medical record.   Review of Systems: No headache, visual changes, nausea, vomiting, diarrhea, constipation, dizziness, abdominal pain, skin rash, fevers, chills, night sweats, weight loss, swollen lymph nodes, body aches, joint swelling, muscle aches, chest pain, shortness of breath, mood changes.   Objective Blood pressure 154/92, pulse 75, height 6\' 1"  (1.854 m), SpO2 98 %.  General: No apparent distress alert and oriented x3 mood and affect  normal, dressed appropriately.  HEENT: Pupils equal, extraocular movements intact  Respiratory: Patient's speak in full sentences and does not appear short of breath  Cardiovascular: No lower extremity edema, non tender, no erythema  Skin: Warm dry intact with no signs of infection or rash on extremities or on axial skeleton.  Abdomen: Soft nontender  Neuro: Cranial nerves II through XII are intact, neurovascularly intact in all extremities with 2+ DTRs and 2+ pulses.  Lymph: No lymphadenopathy of posterior or anterior cervical chain or axillae bilaterally.  Gait normal with good balance and coordination.  MSK:  Non tender with full range of motion and good stability and symmetric strength and tone of shoulders, elbows, wrist, hip, and ankles bilaterally.  Knee: Right Trace effusion noted Tender to palpation mostly over the popliteal fossa as well as the medial joint line Decreased range of motion lacking the last 5 of extension as well as lying 95 of flexion Laxity of both the anterior cruciate ligament and PCL. Difficult to assess secondary to effusion MCL and LCL intact Negative Mcmurray's, Apley's, and Thessalonian tests. Non painful patellar compression. Patellar glide without crepitus. Patellar and quadriceps tendons unremarkable. Weakness of the hamstring compared to the contralateral side. Tender to palpation over the insertion of the medial hamstring tendon. Contralateral knee unremarkable  MSK US performed of:  Right knee This study was ordered, performed, and interpreted by Charlann Boxer D.O.  Knee: All structures visualized. Effusion of the knee noted Anteromedial, anterolateral, posteromedial, and posterolateral menisci unremarkable without tearing, fraying, effusion, or displacement. Patellar Tendon unremarkable on long and transverse views without effusion. No abnormality of prepatellar bursa. LCL and MCL unremarkable on long and transverse views. Patient does have a  distal hamstring tear noted. Hypoechoic changes and increasing Doppler flow noted. No significant retraction noted.  IMPRESSION: Knee effusion with hamstring injury     Impression and Recommendations:     This case required medical decision making of moderate complexity.

## 2014-11-28 NOTE — Assessment & Plan Note (Signed)
Patient does have a knee effusion as well as some mild laxity of the anterior cruciate ligament as well as PCL. Difficult to assess secondary to the amount of fluid. We discussed the possibility of an aspiration the patient has elected try the knee immobilizer icing, and we will get x-rays to rule out any other bony abdomen that can be contribute in. Patient does not make any significant improvement I do think advance imaging is warranted due to the instability of the knee that is felt today. Patient though otherwise will come back in 10 days. Patient is on seated work only. Meloxicam given for pain relief.

## 2014-11-28 NOTE — Telephone Encounter (Signed)
Resent rx into correct pharmacy. 

## 2014-11-28 NOTE — Patient Instructions (Signed)
Minimal Blood Pressure Goal= AVERAGE < 140/90;  Ideal is an AVERAGE < 135/85. This AVERAGE should be calculated from @ least 5-7 BP readings taken @ different times of day on different days of week. You should not respond to isolated BP readings , but rather the AVERAGE for that week .Please bring your  blood pressure cuff to office visits to verify that it is reliable.It  can also be checked against the blood pressure device at the pharmacy. Finger or wrist cuffs are not dependable; an arm cuff is.Fill the  prescription for the BP medication if BP NOT @ goal based on  7 to 14 day average. 

## 2014-11-28 NOTE — Telephone Encounter (Signed)
Patient Name: Jake [name]  DOB: 1956/01/27    Initial Comment Caller states, twisted ligament in Rt leg yesterday , can not walk today. He has an appt at 1030am today, was sent to triage to see if he needs to do anything else before then.    Nurse Assessment  Nurse: Raphael Gibney, RN, Vanita Ingles Date/Time (Eastern Time): 11/28/2014 9:12:38 AM  Confirm and document reason for call. If symptomatic, describe symptoms. ---Caller states he was sitting on the side of the pool and went to get up. Twisted his right leg getting up. leg is swollen on the back of his leg. Injury happened yesterday afternoon. He is using ice. Has appt at 10:30 am. Can not bear weight on his leg.  Has the patient traveled out of the country within the last 30 days? ---No  Does the patient require triage? ---Yes  Related visit to physician within the last 2 weeks? ---No  Does the PT have any chronic conditions? (i.e. diabetes, asthma, etc.) ---No     Guidelines    Guideline Title Affirmed Question Affirmed Notes  Leg Injury [1] SEVERE pain (e.g. excruciating) AND [2] not improved 2 hours after pain medicine/ice packs    Final Disposition User   See Physician within 4 Hours (or PCP triage) Raphael Gibney, RN, Vera    Comments  Already has appt at 10:30 am.   Referrals  REFERRED TO PCP OFFICE   Disagree/Comply: Comply

## 2014-11-28 NOTE — Progress Notes (Signed)
   Subjective:    Patient ID: Jake [name], male    DOB: [birth year], 59 y.o.   MRN: [id number]  HPI   He was sitting on the side of the swimming pool yesterday at approximately 6 PM. He put his weight on his hands and tried to stand; but his right foot/ankle was rotated laterally. He felt acute pain in the right posteriolateral knee. He did not hear or feel a "pop".  The pain had actually improved with ambulation last night. This morning there was recurrence of the severe pain "inside the knee" from the kneecap to the popliteal space. He is unable to bear weight.  He has a history of hypertension but is not on antihypertensives at this time.    Review of Systems   He denies any fever, chills, sweats, rash, skin color change, or cardiopulmonary symptoms.     Objective:   Physical Exam Pertinent or positive findings include: He puts no weight on the right leg. There is visible swelling of the right popliteal space. There is a tight effusion of the right knee. There is no change in color or temperature of the skin in this area. Pedal pulses are intact with no ischemic change.   General appearance :adequately nourished; in no distress.  Eyes: No conjunctival inflammation or scleral icterus is present.  Heart:  Normal rate and regular rhythm. S1 and S2 normal without gallop, murmur, click, rub or other extra sounds    Lungs:Chest clear to auscultation; no wheezes, rhonchi,rales ,or rubs present.No increased work of breathing.    Vascular : all pulses equal ; no bruits present.  Skin:Warm & dry.  Intact without suspicious lesions or rashes ; no tenting or jaundice   Lymphatic: No lymphadenopathy is noted about the head, neck, axilla   Neuro: Strength, tone  normal.         Assessment & Plan:  #1 acute effusion of the right knee following an acute straining injury of the right ankle and knee. Rule out hemarthrosis.  #2 elevated blood pressure due to severe pain. See  orders and AVS

## 2014-11-28 NOTE — Patient Instructions (Signed)
Good to see you Ice 20 minutes 2 times daily. Usually after activity and before bed. Wear knee immbolizer day and night for next week.  Meloxicam daily for 10 days thn as needed Xray today.  See me again 245 next wednesday.

## 2014-11-29 NOTE — Telephone Encounter (Signed)
Discussed with pt

## 2014-11-29 NOTE — Telephone Encounter (Signed)
Kathlee Nations, pt called in and said that he would like to know if someone could call him about his results of the xtrays.  He is said that he is having anxiety about it right now and NEEDS today know today!!     [phone]

## 2014-12-07 ENCOUNTER — Encounter: Payer: Self-pay | Admitting: Family Medicine

## 2014-12-07 ENCOUNTER — Ambulatory Visit (INDEPENDENT_AMBULATORY_CARE_PROVIDER_SITE_OTHER): Payer: 59 | Admitting: Family Medicine

## 2014-12-07 VITALS — BP 140/80 | HR 76 | Wt 197.0 lb

## 2014-12-07 DIAGNOSIS — M25461 Effusion, right knee: Secondary | ICD-10-CM | POA: Diagnosis not present

## 2014-12-07 NOTE — Assessment & Plan Note (Signed)
Longer significant instability as noted previously. I do think the patient will likely have trouble with a hamstring injury. Patient work with Product/process development scientist to learn home exercises and strengthening. We discussed more of a hinge brace which patient declined. Patient will continue with the icing. Patient will come back and see me again in 2-3 weeks. Patient can take meloxicam as needed. In 2-3 weeks I would like to ultrasound the area again in nature that the effusion has completely resolved. Otherwise advance imaging would be warranted if there is any instability or continued effusion.

## 2014-12-07 NOTE — Progress Notes (Signed)
[name] [address] [place], [place] [address] Phone: [phone] Subjective:    I'm seeing this patient by the request  of:  Hopper MD.   CC: Right knee injury follow up  [id number] Jake [name] is a 59 y.o. male coming in with complaint of right knee injury. Patient was seen previously and did have a large knee effusion as well as what seemed to be a hamstring injury. Patient elected try conservative therapy and immobilizer. Patient took his knee out of the immobilizer and has been moving and fairly regularly. States that the swelling is improving. States that he does not feel that there is any instability. States that he is 50-60% better already. Most of the pain seems to be on the posterior aspect of the knee and some mild weakness with flexion of the knee states. No nighttime awakening. Respond well to oral anti-inflammatory     No past medical history on file. No past surgical history on file. Social History  Substance Use Topics  . Smoking status: Light Tobacco Smoker -- 0.25 packs/day for 25 years    Types: Cigarettes  . Smokeless tobacco: Never Used  . Alcohol Use: 3.0 oz/week    5 Shots of liquor per week   No Known Allergies Family History  Problem Relation Age of Onset  . Heart disease Mother   . Hypertension Brother   . Early death Neg Hx   . Cancer Neg Hx   . Alcohol abuse Neg Hx   . Hyperlipidemia Neg Hx   . Stroke Neg Hx   . Kidney disease Neg Hx   . COPD Neg Hx   . Depression Neg Hx   . Diabetes Neg Hx   . Drug abuse Neg Hx     Past medical history, social, surgical and family history all reviewed in electronic medical record.   Review of Systems: No headache, visual changes, nausea, vomiting, diarrhea, constipation, dizziness, abdominal pain, skin rash, fevers, chills, night sweats, weight loss, swollen lymph nodes, body aches, joint swelling, muscle aches, chest pain, shortness of breath, mood changes.    Objective There were no vitals taken for this visit.  General: No apparent distress alert and oriented x3 mood and affect normal, dressed appropriately.  HEENT: Pupils equal, extraocular movements intact  Respiratory: Patient's speak in full sentences and does not appear short of breath  Cardiovascular: No lower extremity edema, non tender, no erythema  Skin: Warm dry intact with no signs of infection or rash on extremities or on axial skeleton.  Abdomen: Soft nontender  Neuro: Cranial nerves II through XII are intact, neurovascularly intact in all extremities with 2+ DTRs and 2+ pulses.  Lymph: No lymphadenopathy of posterior or anterior cervical chain or axillae bilaterally.  Gait normal with good balance and coordination.  MSK:  Non tender with full range of motion and good stability and symmetric strength and tone of shoulders, elbows, wrist, hip, and ankles bilaterally.  Knee: Right Trace effusion noted improved Tender to palpation mostly over the popliteal fossa as well as the medial joint line Full range of motion Improved anterior cruciate ligament and no significant instability noted. Negative Mcmurray's, Apley's, and Thessalonian tests. Non painful patellar compression. Patellar glide without crepitus. Patellar and quadriceps tendons unremarkable. Still some mild weakness of the hamstring compared to the contralateral side. Contralateral knee unremarkable  Procedure note 82505; 15 minutes spent for Therapeutic exercises as stated in above notes.  This included exercises focusing on  stretching, strengthening, with significant focus on eccentric aspects.  Flexion and extension exercises as well as hamstring and abductor exercises for strengthening and coordination. Proper technique shown and discussed handout in great detail with ATC.  All questions were discussed and answered.     Impression and Recommendations:     This case required medical decision making of moderate  complexity.

## 2014-12-07 NOTE — Patient Instructions (Signed)
Good to see you Ice still at the end of the day.  Exercises 3 times a week.  Ok to walk 2 miles now and increase 1 mile a week.  See me again in 3 weeks.

## 2014-12-27 ENCOUNTER — Encounter: Payer: Self-pay | Admitting: Family Medicine

## 2014-12-27 ENCOUNTER — Other Ambulatory Visit (INDEPENDENT_AMBULATORY_CARE_PROVIDER_SITE_OTHER): Payer: 59

## 2014-12-27 ENCOUNTER — Ambulatory Visit (INDEPENDENT_AMBULATORY_CARE_PROVIDER_SITE_OTHER): Payer: 59 | Admitting: Family Medicine

## 2014-12-27 VITALS — BP 132/82 | HR 85 | Ht 73.0 in | Wt 197.0 lb

## 2014-12-27 DIAGNOSIS — M25461 Effusion, right knee: Secondary | ICD-10-CM | POA: Diagnosis not present

## 2014-12-27 DIAGNOSIS — M25561 Pain in right knee: Secondary | ICD-10-CM | POA: Diagnosis not present

## 2014-12-27 NOTE — Patient Instructions (Signed)
Good to see you Ice in 6 hours Continue everything else you are doing.  See me again in 6 weeks if not perfect or call sooner if not doing well.

## 2014-12-27 NOTE — Progress Notes (Signed)
Pre visit review using our clinic review tool, if applicable. No additional management support is needed unless otherwise documented below in the visit note. 

## 2014-12-27 NOTE — Progress Notes (Signed)
Jake [name], Jake [name] [id number] Phone: [phone] Subjective:    I'm seeing this patient by the request  of:  Hopper MD.   CC: Right knee injury follow up  RCB:[id number] Jake [name] is a 59 y.o. male coming in with complaint of right knee injury. Patient was seen previously and did have a large knee effusion as well as what seemed to be a hamstring injury. Patient initially was in the knee immobilizer and felt about 50-60% better. Patient was then put in a hinge brace and was to increase his activity and movement. Patient states he continues to feel good. Only pain with deep flexion of the knee. Still has some effusion noted. No clicking or giving out on him. Anti-inflammatory's have been very helpful. Not able to do all activities such as running or anything at this time. States though that the posterior aspect of the knee seems to feel much better.    No past medical history on file. No past surgical history on file. Social History  Substance Use Topics  . Smoking status: Light Tobacco Smoker -- 0.25 packs/day for 25 years    Types: Cigarettes  . Smokeless tobacco: Never Used  . Alcohol Use: 3.0 oz/week    5 Shots of liquor per week   No Known Allergies Family History  Problem Relation Age of Onset  . Heart disease Mother   . Hypertension Brother   . Early death Neg Hx   . Cancer Neg Hx   . Alcohol abuse Neg Hx   . Hyperlipidemia Neg Hx   . Stroke Neg Hx   . Kidney disease Neg Hx   . COPD Neg Hx   . Depression Neg Hx   . Diabetes Neg Hx   . Drug abuse Neg Hx     Past medical history, social, surgical and family history all reviewed in electronic medical record.   Review of Systems: No headache, visual changes, nausea, vomiting, diarrhea, constipation, dizziness, abdominal pain, skin rash, fevers, chills, night sweats, weight loss, swollen lymph nodes, body aches, joint swelling, muscle aches, chest pain, shortness of  breath, mood changes.   Objective Blood pressure 132/82, pulse 85, height 6\' 1"  (1.854 m), weight 197 lb (89.359 kg), SpO2 97 %.  General: No apparent distress alert and oriented x3 mood and affect normal, dressed appropriately.  HEENT: Pupils equal, extraocular movements intact  Respiratory: Patient's speak in full sentences and does not appear short of breath  Cardiovascular: No lower extremity edema, non tender, no erythema  Skin: Warm dry intact with no signs of infection or rash on extremities or on axial skeleton.  Abdomen: Soft nontender  Neuro: Cranial nerves II through XII are intact, neurovascularly intact in all extremities with 2+ DTRs and 2+ pulses.  Lymph: No lymphadenopathy of posterior or anterior cervical chain or axillae bilaterally.  Gait normal with good balance and coordination.  MSK:  Non tender with full range of motion and good stability and symmetric strength and tone of shoulders, elbows, wrist, hip, and ankles bilaterally.  Knee: Right Continued effusion Less tender than previous exam Full range of motion Improved anterior cruciate ligament and no significant instability noted. Negative Mcmurray's, Apley's, and Thessalonian tests. Non painful patellar compression. Patellar glide without crepitus. Patellar and quadriceps tendons unremarkable. Still some mild weakness of the hamstring compared to the contralateral side. Contralateral knee unremarkable  MSK US performed of: Right knee This study was ordered, performed, and interpreted  by Charlann Boxer D.O.  Knee: Continued effusion noted with mild narrowing of the lateral joint line with questionable meniscal injury noted.   Procedure: Real-time Ultrasound Guided Injection of right knee Device: GE Logiq E  Ultrasound guided injection is preferred based studies that show increased duration, increased effect, greater accuracy, decreased procedural pain, increased response rate, and decreased cost with ultrasound  guided versus blind injection.  Verbal informed consent obtained.  Time-out conducted.  Noted no overlying erythema, induration, or other signs of local infection.  Skin prepped in a sterile fashion.  Local anesthesia: Topical Ethyl chloride.  With sterile technique and under real time ultrasound guidance: With a 22-gauge 2 inch needle patient was injected with 4 cc of 0.5% Marcaine and then aspirated 30 mL of strawlike fluid then injected 1 cc of Kenalog 40 mg/dL. This was from a superior lateral approach.  Completed without difficulty  Pain immediately resolved suggesting accurate placement of the medication.  Advised to call if fevers/chills, erythema, induration, drainage, or persistent bleeding.  Images permanently stored and available for review in the ultrasound unit.  Impression: Technically successful ultrasound guided injection.      Impression and Recommendations:     This case required medical decision making of moderate complexity.

## 2014-12-27 NOTE — Assessment & Plan Note (Signed)
Patient had aspiration and injection done today. Am hoping that this will be beneficial. We discussed the possibility of bracing or formal physical therapy which she declined. Would refill anti-inflammatory set needed. Patient has any locking or giving out on him he should call because advance imaging would be warranted. We discussed that there could still be a ligamentous injury but no significant laxity noted on exam today. I do think that there is a likelihood that patient does have a meniscal injury as well. Patient hamstring injury seems to be healed. Patient will follow-up again in 6 weeks or contact us sooner if any of these above symptoms occur.  Spent  25 minutes with patient face-to-face and had greater than 50% of counseling including as described above in assessment and plan.

## 2015-02-08 ENCOUNTER — Ambulatory Visit: Payer: 59 | Admitting: Family Medicine

## 2015-02-13 ENCOUNTER — Encounter: Payer: Self-pay | Admitting: Internal Medicine

## 2015-02-13 ENCOUNTER — Ambulatory Visit (INDEPENDENT_AMBULATORY_CARE_PROVIDER_SITE_OTHER): Payer: 59 | Admitting: Internal Medicine

## 2015-02-13 ENCOUNTER — Other Ambulatory Visit (INDEPENDENT_AMBULATORY_CARE_PROVIDER_SITE_OTHER): Payer: 59

## 2015-02-13 VITALS — BP 122/86 | HR 87 | Temp 98.5°F | Resp 16 | Ht 73.0 in | Wt 194.0 lb

## 2015-02-13 DIAGNOSIS — Z1211 Encounter for screening for malignant neoplasm of colon: Secondary | ICD-10-CM

## 2015-02-13 DIAGNOSIS — Z0000 Encounter for general adult medical examination without abnormal findings: Secondary | ICD-10-CM | POA: Diagnosis not present

## 2015-02-13 LAB — CBC WITH DIFFERENTIAL/PLATELET
BASOS ABS: 0 10*3/uL (ref 0.0–0.1)
BASOS PCT: 0.4 % (ref 0.0–3.0)
EOS ABS: 0.1 10*3/uL (ref 0.0–0.7)
Eosinophils Relative: 1.1 % (ref 0.0–5.0)
HCT: 43.3 % (ref 39.0–52.0)
HEMOGLOBIN: 14.8 g/dL (ref 13.0–17.0)
Lymphocytes Relative: 21.8 % (ref 12.0–46.0)
Lymphs Abs: 1.5 10*3/uL (ref 0.7–4.0)
MCHC: 34 g/dL (ref 30.0–36.0)
MCV: 93.1 fl (ref 78.0–100.0)
MONO ABS: 0.5 10*3/uL (ref 0.1–1.0)
Monocytes Relative: 6.6 % (ref 3.0–12.0)
Neutro Abs: 4.9 10*3/uL (ref 1.4–7.7)
Neutrophils Relative %: 70.1 % (ref 43.0–77.0)
Platelets: 144 10*3/uL — ABNORMAL LOW (ref 150.0–400.0)
RBC: 4.65 Mil/uL (ref 4.22–5.81)
RDW: 12.3 % (ref 11.5–15.5)
WBC: 7 10*3/uL (ref 4.0–10.5)

## 2015-02-13 LAB — LIPID PANEL
CHOLESTEROL: 196 mg/dL (ref 0–200)
HDL: 87.2 mg/dL (ref >39.00)
LDL Cholesterol: 95 mg/dL (ref 0–99)
NonHDL: 108.31
TRIGLYCERIDES: 68 mg/dL (ref 0.0–149.0)
Total CHOL/HDL Ratio: 2
VLDL: 13.6 mg/dL (ref 0.0–40.0)

## 2015-02-13 LAB — COMPREHENSIVE METABOLIC PANEL
ALBUMIN: 4.4 g/dL (ref 3.5–5.2)
ALK PHOS: 79 U/L (ref 39–117)
ALT: 40 U/L (ref 0–53)
AST: 25 U/L (ref 0–37)
BILIRUBIN TOTAL: 0.7 mg/dL (ref 0.2–1.2)
BUN: 23 mg/dL (ref 6–23)
CHLORIDE: 105 meq/L (ref 96–112)
CO2: 26 mEq/L (ref 19–32)
CREATININE: 1.07 mg/dL (ref 0.40–1.50)
Calcium: 9.8 mg/dL (ref 8.4–10.5)
GFR: 75.5 mL/min (ref >60.00)
Glucose, Bld: 106 mg/dL — ABNORMAL HIGH (ref 70–99)
Potassium: 4.5 mEq/L (ref 3.5–5.1)
SODIUM: 141 meq/L (ref 135–145)
TOTAL PROTEIN: 6.4 g/dL (ref 6.0–8.3)

## 2015-02-13 LAB — HIV ANTIBODY (ROUTINE TESTING W REFLEX): HIV: NONREACTIVE

## 2015-02-13 LAB — TSH: TSH: 0.64 u[IU]/mL (ref 0.35–4.50)

## 2015-02-13 LAB — FECAL OCCULT BLOOD, GUAIAC: FECAL OCCULT BLD: NEGATIVE

## 2015-02-13 LAB — PSA: PSA: 1.13 ng/mL (ref 0.10–4.00)

## 2015-02-13 NOTE — Assessment & Plan Note (Signed)
He refused a flu vaccine. He has not had a colonoscopy so I sent a referral to GI. Exam completed. Labs ordered and bit will be reviewed. He was given patient Air traffic controller.

## 2015-02-13 NOTE — Patient Instructions (Signed)

## 2015-02-13 NOTE — Progress Notes (Signed)
Pre visit review using our clinic review tool, if applicable. No additional management support is needed unless otherwise documented below in the visit note. 

## 2015-02-13 NOTE — Progress Notes (Signed)
Subjective:  Patient ID: Jake [name], male    DOB: 23-Dec-1955  Age: 59 y.o. MRN: [id number]  CC: Annual Exam   HPI Jake [name] presents for a physical, he feels well and offers no complaints.  Outpatient Prescriptions Prior to Visit  Medication Sig Dispense Refill  . fluticasone (FLONASE) 50 MCG/ACT nasal spray Place 2 sprays into both nostrils daily. (Patient taking differently: Place 2 sprays into both nostrils daily as needed. ) 16 g 11  . Multiple Vitamins-Minerals (CENTRUM SILVER ADULT 50+) TABS Take by mouth.    . Tobramycin-Dexamethasone 0.3-0.05 % SUSP Apply 1 drop to eye 4 (four) times daily. 5 mL 0  . meloxicam (MOBIC) 15 MG tablet Take 1 tablet (15 mg total) by mouth daily. 30 tablet 0  . metoprolol tartrate (LOPRESSOR) 25 MG tablet Take 1 tablet (25 mg total) by mouth 2 (two) times daily. 60 tablet 2  . Glucosamine-Chondroit-Vit C-Mn (GLUCOSAMINE 1500 COMPLEX PO) Take by mouth.     No facility-administered medications prior to visit.    ROS Review of Systems  Constitutional: Negative.  Negative for fever, chills, diaphoresis, appetite change and fatigue.  HENT: Negative.   Eyes: Negative.   Respiratory: Negative.  Negative for cough, choking, chest tightness, shortness of breath and stridor.   Cardiovascular: Negative.  Negative for chest pain, palpitations and leg swelling.  Gastrointestinal: Negative.  Negative for nausea, vomiting, abdominal pain, diarrhea, constipation and blood in stool.  Endocrine: Negative.   Genitourinary: Negative.  Negative for urgency, difficulty urinating and testicular pain.  Musculoskeletal: Negative.  Negative for myalgias, back pain, joint swelling and arthralgias.  Skin: Negative.   Allergic/Immunologic: Negative.   Neurological: Negative.  Negative for dizziness.  Hematological: Negative.  Negative for adenopathy. Does not bruise/bleed easily.  Psychiatric/Behavioral: Negative.     Objective:  BP 122/86 mmHg  Pulse 87   Temp(Src) 98.5 F (36.9 C) (Oral)  Resp 16  Ht 6\' 1"  (1.854 m)  Wt 194 lb (87.998 kg)  BMI 25.60 kg/m2  SpO2 96%  BP Readings from Last 3 Encounters:  02/13/15 122/86  12/27/14 132/82  12/07/14 140/80    Wt Readings from Last 3 Encounters:  02/13/15 194 lb (87.998 kg)  12/27/14 197 lb (89.359 kg)  12/07/14 197 lb (89.359 kg)    Physical Exam  Constitutional: He is oriented to person, place, and time. He appears well-developed and well-nourished. No distress.  HENT:  Head: Normocephalic and atraumatic.  Mouth/Throat: Oropharynx is clear and moist. No oropharyngeal exudate.  Eyes: Conjunctivae are normal. Right eye exhibits no discharge. Left eye exhibits no discharge. No scleral icterus.  Neck: Normal range of motion. Neck supple. No JVD present. No tracheal deviation present. No thyromegaly present.  Cardiovascular: Normal rate, regular rhythm, normal heart sounds and intact distal pulses.  Exam reveals no gallop and no friction rub.   No murmur heard. Pulmonary/Chest: Effort normal and breath sounds normal. No stridor. No respiratory distress. He has no wheezes. He has no rales. He exhibits no tenderness.  Abdominal: Soft. Bowel sounds are normal. He exhibits no distension and no mass. There is no tenderness. There is no rebound and no guarding. Hernia confirmed negative in the right inguinal area and confirmed negative in the left inguinal area.  Genitourinary: Rectum normal, prostate normal and penis normal. Rectal exam shows no external hemorrhoid, no internal hemorrhoid, no fissure, no mass, no tenderness and anal tone normal. Guaiac negative stool. Prostate is not enlarged and not tender. Right testis  shows no mass, no swelling and no tenderness. Right testis is descended. Left testis shows no mass, no swelling and no tenderness. Left testis is descended. Circumcised. No penile erythema or penile tenderness. No discharge found.  Musculoskeletal: Normal range of motion. He  exhibits no edema or tenderness.  Lymphadenopathy:    He has no cervical adenopathy.       Right: No inguinal adenopathy present.       Left: No inguinal adenopathy present.  Neurological: He is oriented to person, place, and time.  Skin: Skin is warm and dry. No rash noted. He is not diaphoretic. No erythema. No pallor.  Psychiatric: He has a normal mood and affect. His behavior is normal. Judgment and thought content normal.  Vitals reviewed.   Lab Results  Component Value Date   WBC 7.0 02/13/2015   HGB 14.8 02/13/2015   HCT 43.3 02/13/2015   PLT 144.0* 02/13/2015   GLUCOSE 106* 02/13/2015   CHOL 196 02/13/2015   TRIG 68.0 02/13/2015   HDL 87.20 02/13/2015   LDLCALC 95 02/13/2015   ALT 40 02/13/2015   AST 25 02/13/2015   NA 141 02/13/2015   K 4.5 02/13/2015   CL 105 02/13/2015   CREATININE 1.07 02/13/2015   BUN 23 02/13/2015   CO2 26 02/13/2015   TSH 0.64 02/13/2015   PSA 1.13 02/13/2015    [place] Extrem Low Left Ltd  11/29/2014  MSK US performed of: Right knee This study was ordered, performed, and interpreted by Charlann Boxer D.O. Knee: All structures visualized. Effusion of the knee noted Anteromedial, anterolateral, posteromedial, and posterolateral menisci unremarkable without tearing, fraying, effusion, or displacement. Patellar Tendon unremarkable on long and transverse views without effusion. No abnormality of prepatellar bursa. LCL and MCL unremarkable on long and transverse views. Patient does have a distal hamstring tear noted. Hypoechoic changes and increasing Doppler flow noted. No significant retraction noted.   11/29/2014  Knee effusion with hamstring injury  Dg Knee Complete 4 Views Right  11/28/2014  CLINICAL DATA:  Patient with right knee pain since yesterday. No reported traumatic injury. EXAM: RIGHT KNEE - COMPLETE 4+ VIEW COMPARISON:  None. FINDINGS: Normal anatomic alignment. No evidence for acute fracture or dislocation. Multiple well corticated ossific  densities anterior to the proximal tibia. Lateral compartment joint space narrowing with osteophyte formation. No definite joint effusion. IMPRESSION: No evidence for acute osseous abnormality. Lateral compartment degenerative changes. Electronically Signed   By: Lovey Newcomer M.D.   On: 11/28/2014 13:21    Assessment & Plan:    I have discontinued Jake [name]'s Glucosamine-Chondroit-Vit C-Mn (GLUCOSAMINE 1500 COMPLEX PO), metoprolol tartrate, and meloxicam. I am also having him maintain his CENTRUM SILVER ADULT 50+, Tobramycin-Dexamethasone, and fluticasone.  No orders of the defined types were placed in this encounter.     Follow-up: Return if symptoms worsen or fail to improve.  Scarlette Calico, MD

## 2015-02-14 ENCOUNTER — Encounter: Payer: Self-pay | Admitting: Internal Medicine

## 2015-08-07 DIAGNOSIS — J3489 Other specified disorders of nose and nasal sinuses: Secondary | ICD-10-CM | POA: Insufficient documentation

## 2015-08-07 DIAGNOSIS — H6122 Impacted cerumen, left ear: Secondary | ICD-10-CM | POA: Insufficient documentation

## 2016-10-04 ENCOUNTER — Telehealth: Payer: Self-pay | Admitting: Internal Medicine

## 2016-10-04 NOTE — Telephone Encounter (Signed)
Patient may call on Monday July 9. I informed him I will be out of office. He may ask to speak with you Tammy. He has a friend he wanted to ask Dr. Ronnald Ramp about taking on as a patient. But he needs to get his information. He also is going to set up his CPE. He did not have one last year. He needed to check his schedule.

## 2016-10-07 NOTE — Telephone Encounter (Signed)
Noted  

## 2016-10-08 IMAGING — CR DG KNEE COMPLETE 4+V*R*
4 series · 4 of 4 positions shown · non-contrast
Comparison: None.

CLINICAL DATA: Patient with right knee pain since yesterday. No
reported traumatic injury.

EXAM:
RIGHT KNEE - COMPLETE 4+ VIEW

[view not recorded (1 of 4)]
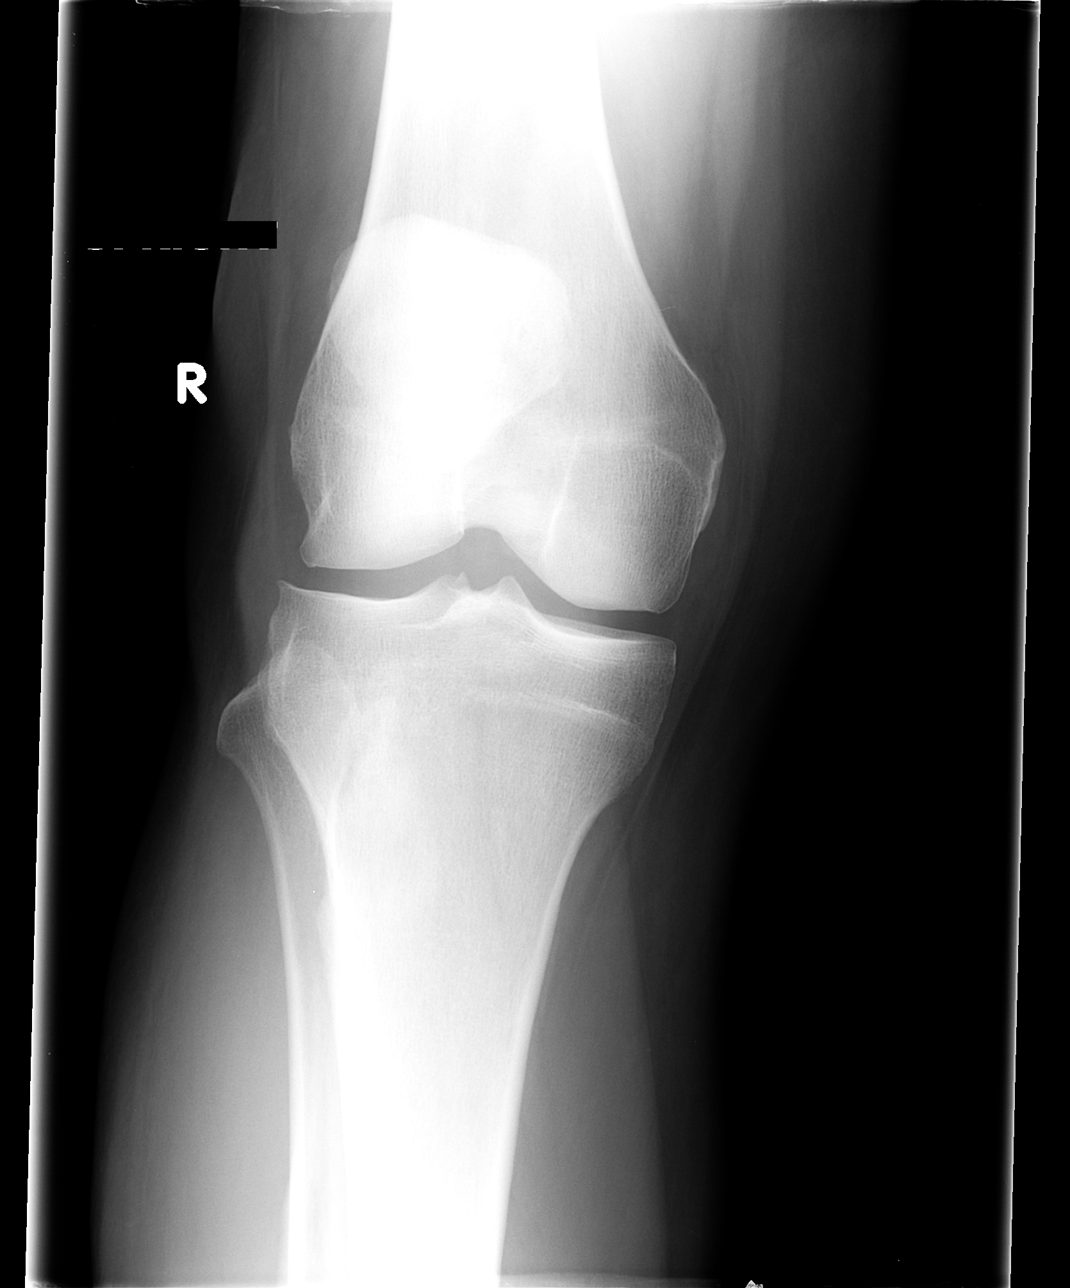

[view not recorded (2 of 4)]
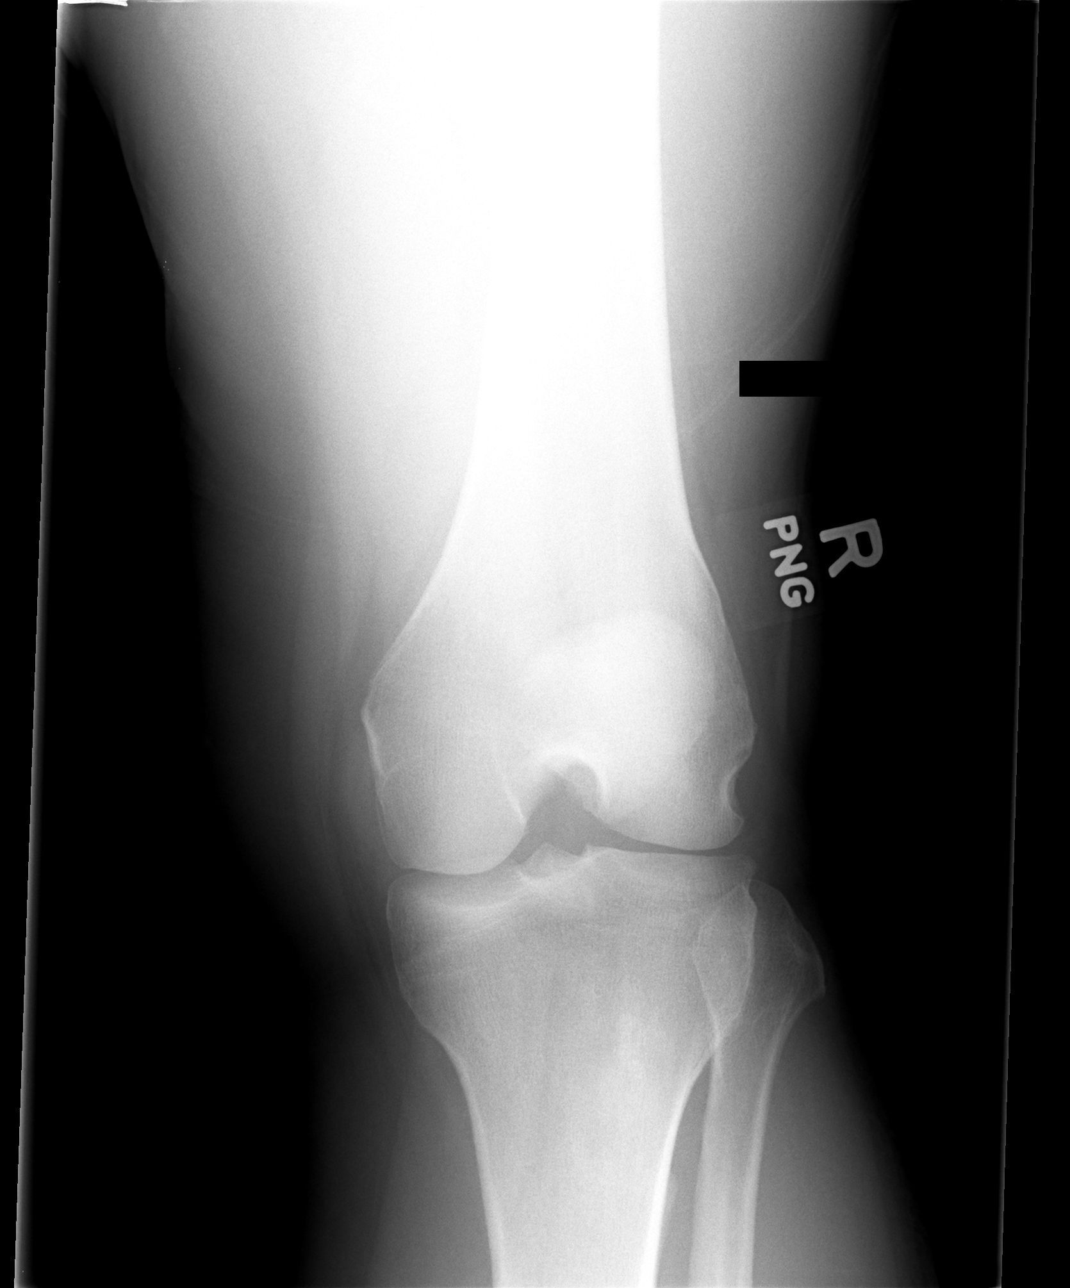

[view not recorded (3 of 4)]
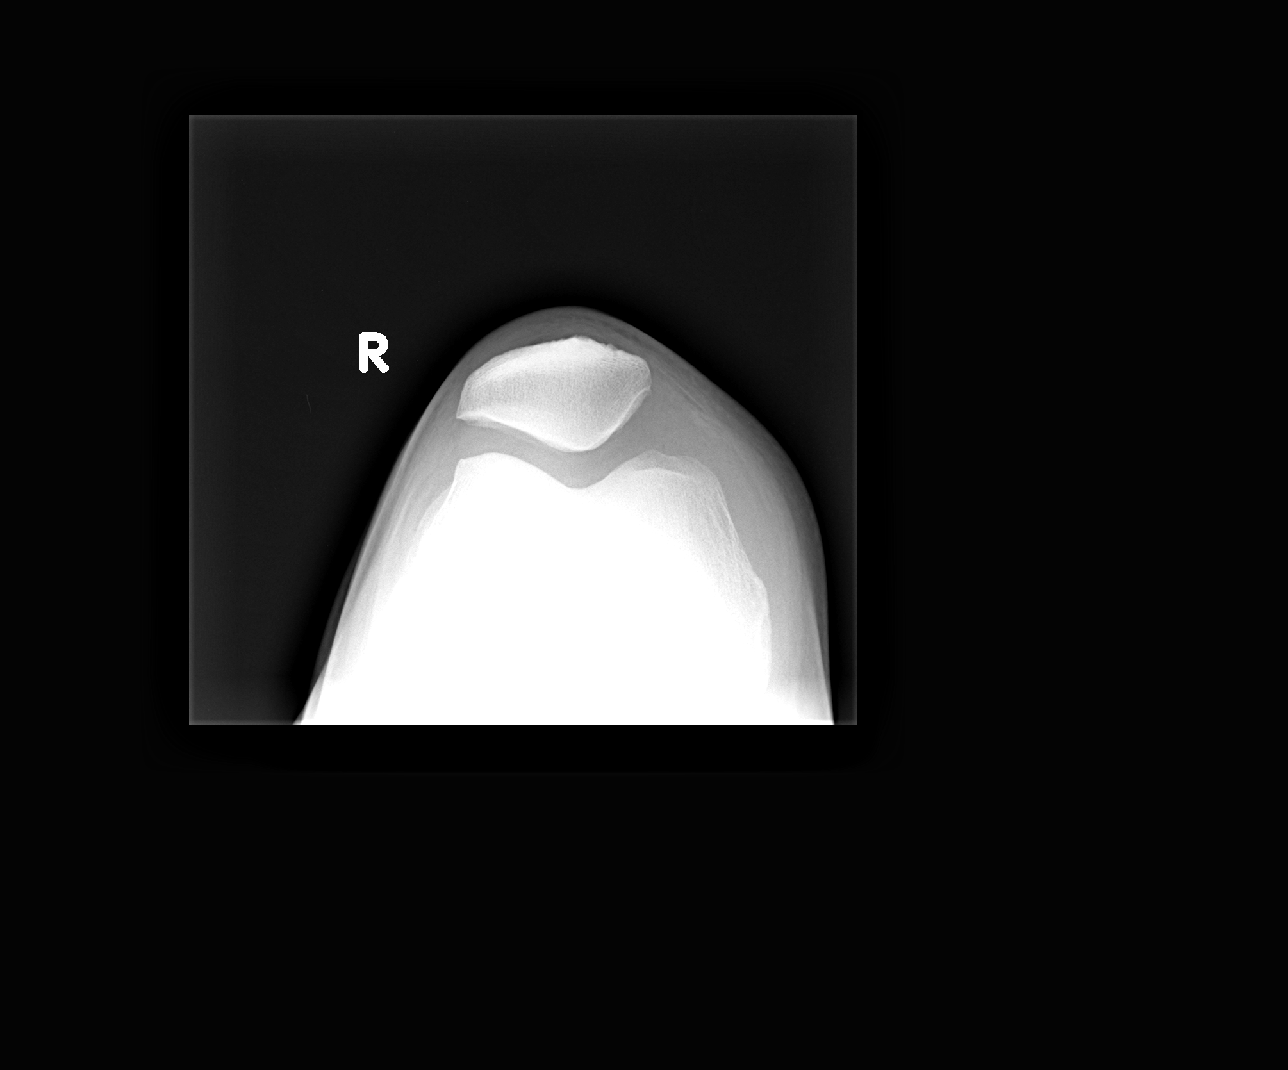

[view not recorded (4 of 4)]
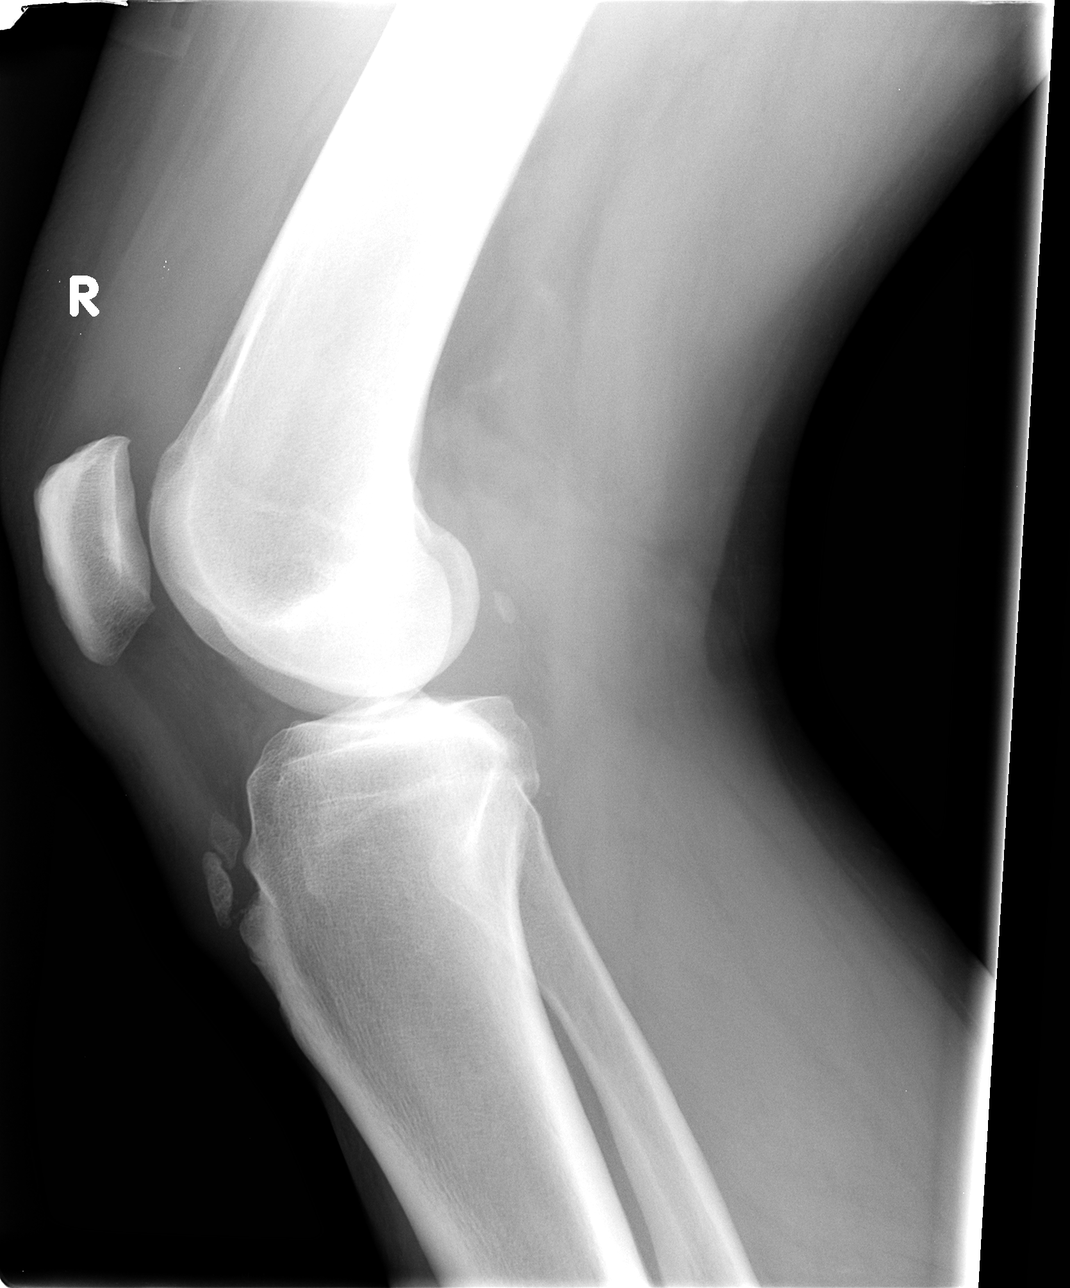

[4 of 4 positions shown; findings below may reference images not displayed]

FINDINGS: Normal anatomic alignment. No evidence for acute fracture or
dislocation. Multiple well corticated ossific densities anterior to
the proximal tibia. Lateral compartment joint space narrowing with
osteophyte formation. No definite joint effusion.
IMPRESSION: No evidence for acute osseous abnormality.

Lateral compartment degenerative changes.

## 2017-05-21 ENCOUNTER — Other Ambulatory Visit (HOSPITAL_COMMUNITY): Payer: Self-pay | Admitting: General Surgery

## 2017-05-21 DIAGNOSIS — C4359 Malignant melanoma of other part of trunk: Secondary | ICD-10-CM

## 2017-05-29 ENCOUNTER — Encounter (HOSPITAL_COMMUNITY)
Admission: RE | Admit: 2017-05-29 | Discharge: 2017-05-29 | Disposition: A | Discharge status: Home / Self Care | Payer: BLUE CROSS/BLUE SHIELD | Source: Ambulatory Visit | Attending: General Surgery | Admitting: General Surgery

## 2017-05-29 DIAGNOSIS — C4359 Malignant melanoma of other part of trunk: Secondary | ICD-10-CM | POA: Diagnosis present

## 2017-05-29 MED ORDER — TECHNETIUM TC 99M SULFUR COLLOID FILTERED
0.5000 | Freq: Once | INTRAVENOUS | Status: AC | PRN
  Administered 2017-05-29: 0.5 via INTRADERMAL

## 2017-06-11 ENCOUNTER — Ambulatory Visit: Payer: Self-pay | Admitting: General Surgery

## 2017-06-11 DIAGNOSIS — C4359 Malignant melanoma of other part of trunk: Secondary | ICD-10-CM

## 2017-06-20 ENCOUNTER — Encounter (HOSPITAL_BASED_OUTPATIENT_CLINIC_OR_DEPARTMENT_OTHER): Payer: Self-pay | Admitting: *Deleted

## 2017-06-23 ENCOUNTER — Ambulatory Visit: Payer: Self-pay | Admitting: General Surgery

## 2017-06-23 DIAGNOSIS — C4359 Malignant melanoma of other part of trunk: Secondary | ICD-10-CM

## 2017-06-25 NOTE — Progress Notes (Signed)
Ensure pre surgery drink given with instructions to complete by [place], surgical scrub soap given with instructions, pt verbalized understanding.

## 2017-06-26 ENCOUNTER — Encounter (HOSPITAL_COMMUNITY)
Admission: RE | Admit: 2017-06-26 | Discharge: 2017-06-26 | Disposition: A | Discharge status: Home / Self Care | Payer: BLUE CROSS/BLUE SHIELD | Source: Ambulatory Visit | Attending: General Surgery | Admitting: General Surgery

## 2017-06-26 ENCOUNTER — Other Ambulatory Visit: Payer: Self-pay

## 2017-06-26 ENCOUNTER — Ambulatory Visit (HOSPITAL_BASED_OUTPATIENT_CLINIC_OR_DEPARTMENT_OTHER): Payer: BLUE CROSS/BLUE SHIELD | Admitting: Anesthesiology

## 2017-06-26 ENCOUNTER — Encounter (HOSPITAL_BASED_OUTPATIENT_CLINIC_OR_DEPARTMENT_OTHER): Payer: Self-pay | Admitting: *Deleted

## 2017-06-26 ENCOUNTER — Ambulatory Visit (HOSPITAL_BASED_OUTPATIENT_CLINIC_OR_DEPARTMENT_OTHER)
Admission: RE | Admit: 2017-06-26 | Discharge: 2017-06-26 | Disposition: A | Discharge status: Home / Self Care | Payer: BLUE CROSS/BLUE SHIELD | Source: Ambulatory Visit | Attending: General Surgery | Admitting: General Surgery

## 2017-06-26 ENCOUNTER — Encounter (HOSPITAL_BASED_OUTPATIENT_CLINIC_OR_DEPARTMENT_OTHER)
Admission: RE | Disposition: A | Discharge status: Home / Self Care | Payer: Self-pay | Source: Ambulatory Visit | Attending: General Surgery

## 2017-06-26 DIAGNOSIS — C773 Secondary and unspecified malignant neoplasm of axilla and upper limb lymph nodes: Secondary | ICD-10-CM | POA: Insufficient documentation

## 2017-06-26 DIAGNOSIS — I10 Essential (primary) hypertension: Secondary | ICD-10-CM | POA: Diagnosis not present

## 2017-06-26 DIAGNOSIS — C4359 Malignant melanoma of other part of trunk: Secondary | ICD-10-CM | POA: Diagnosis present

## 2017-06-26 DIAGNOSIS — D0359 Melanoma in situ of other part of trunk: Secondary | ICD-10-CM | POA: Insufficient documentation

## 2017-06-26 DIAGNOSIS — Z87891 Personal history of nicotine dependence: Secondary | ICD-10-CM | POA: Insufficient documentation

## 2017-06-26 HISTORY — PX: MELANOMA EXCISION WITH SENTINEL LYMPH NODE BIOPSY: SHX5267

## 2017-06-26 HISTORY — DX: Unspecified macular degeneration: H35.30

## 2017-06-26 LAB — CBC WITH DIFFERENTIAL/PLATELET
Basophils Absolute: 0 10*3/uL (ref 0.0–0.1)
Basophils Relative: 0 %
EOS ABS: 0.2 10*3/uL (ref 0.0–0.7)
Eosinophils Relative: 4 %
HEMATOCRIT: 42.1 % (ref 39.0–52.0)
HEMOGLOBIN: 14.5 g/dL (ref 13.0–17.0)
LYMPHS ABS: 1.8 10*3/uL (ref 0.7–4.0)
Lymphocytes Relative: 43 %
MCH: 31.7 pg (ref 26.0–34.0)
MCHC: 34.4 g/dL (ref 30.0–36.0)
MCV: 92.1 fL (ref 78.0–100.0)
MONOS PCT: 7 %
Monocytes Absolute: 0.3 10*3/uL (ref 0.1–1.0)
NEUTROS ABS: 1.9 10*3/uL (ref 1.7–7.7)
NEUTROS PCT: 46 %
Platelets: 134 10*3/uL — ABNORMAL LOW (ref 150–400)
RBC: 4.57 MIL/uL (ref 4.22–5.81)
RDW: 12.6 % (ref 11.5–15.5)
WBC: 4.2 10*3/uL (ref 4.0–10.5)

## 2017-06-26 LAB — COMPREHENSIVE METABOLIC PANEL
ALK PHOS: 68 U/L (ref 38–126)
ALT: 25 U/L (ref 17–63)
AST: 19 U/L (ref 15–41)
Albumin: 4 g/dL (ref 3.5–5.0)
Anion gap: 8 (ref 5–15)
BILIRUBIN TOTAL: 1 mg/dL (ref 0.3–1.2)
BUN: 18 mg/dL (ref 6–20)
CALCIUM: 8.9 mg/dL (ref 8.9–10.3)
CO2: 23 mmol/L (ref 22–32)
Chloride: 108 mmol/L (ref 101–111)
Creatinine, Ser: 1.04 mg/dL (ref 0.61–1.24)
Glucose, Bld: 99 mg/dL (ref 65–99)
Potassium: 3.8 mmol/L (ref 3.5–5.1)
Sodium: 139 mmol/L (ref 135–145)
TOTAL PROTEIN: 6.2 g/dL — AB (ref 6.5–8.1)

## 2017-06-26 SURGERY — MELANOMA EXCISION WITH SENTINEL LYMPH NODE BIOPSY
Anesthesia: General | Laterality: Left

## 2017-06-26 MED ORDER — ONDANSETRON HCL 4 MG/2ML IJ SOLN
INTRAMUSCULAR | Status: AC
  Filled 2017-06-26: qty 2

## 2017-06-26 MED ORDER — CHLORHEXIDINE GLUCONATE CLOTH 2 % EX PADS: 6.0000 | MEDICATED_PAD | Freq: Once | CUTANEOUS | Status: DC

## 2017-06-26 MED ORDER — CEFAZOLIN SODIUM-DEXTROSE 2-4 GM/100ML-% IV SOLN
INTRAVENOUS | Status: AC
  Filled 2017-06-26: qty 100

## 2017-06-26 MED ORDER — SUGAMMADEX SODIUM 200 MG/2ML IV SOLN
INTRAVENOUS | Status: DC | PRN
  Administered 2017-06-26: 200 mg via INTRAVENOUS

## 2017-06-26 MED ORDER — SUGAMMADEX SODIUM 200 MG/2ML IV SOLN
INTRAVENOUS | Status: AC
  Filled 2017-06-26: qty 2

## 2017-06-26 MED ORDER — BUPIVACAINE-EPINEPHRINE (PF) 0.25% -1:200000 IJ SOLN
INTRAMUSCULAR | Status: AC
Start: 2017-06-26 — End: 2017-06-26
  Filled 2017-06-26: qty 30

## 2017-06-26 MED ORDER — SCOPOLAMINE 1 MG/3DAYS TD PT72: 1.0000 | MEDICATED_PATCH | Freq: Once | TRANSDERMAL | Status: DC | PRN

## 2017-06-26 MED ORDER — EPHEDRINE SULFATE 50 MG/ML IJ SOLN
INTRAMUSCULAR | Status: DC | PRN
  Administered 2017-06-26: 15 mg via INTRAVENOUS

## 2017-06-26 MED ORDER — MIDAZOLAM HCL 2 MG/2ML IJ SOLN
INTRAMUSCULAR | Status: AC
  Filled 2017-06-26: qty 2

## 2017-06-26 MED ORDER — PROMETHAZINE HCL 25 MG/ML IJ SOLN: 6.2500 mg | INTRAMUSCULAR | Status: DC | PRN

## 2017-06-26 MED ORDER — CEFAZOLIN SODIUM-DEXTROSE 2-4 GM/100ML-% IV SOLN
2.0000 g | INTRAVENOUS | Status: AC
  Administered 2017-06-26: 2 g via INTRAVENOUS

## 2017-06-26 MED ORDER — GABAPENTIN 300 MG PO CAPS
300.0000 mg | ORAL_CAPSULE | ORAL | Status: AC
  Administered 2017-06-26: 300 mg via ORAL

## 2017-06-26 MED ORDER — ACETAMINOPHEN 500 MG PO TABS
1000.0000 mg | ORAL_TABLET | ORAL | Status: AC
  Administered 2017-06-26: 1000 mg via ORAL

## 2017-06-26 MED ORDER — BUPIVACAINE-EPINEPHRINE (PF) 0.25% -1:200000 IJ SOLN
INTRAMUSCULAR | Status: DC | PRN
  Administered 2017-06-26: 25 mL

## 2017-06-26 MED ORDER — SODIUM CHLORIDE 0.9 % IV SOLN
INTRAVENOUS | Status: DC | PRN
  Administered 2017-06-26 (×2): 40 ug via INTRAVENOUS

## 2017-06-26 MED ORDER — ROCURONIUM BROMIDE 100 MG/10ML IV SOLN
INTRAVENOUS | Status: DC | PRN
  Administered 2017-06-26: 50 mg via INTRAVENOUS

## 2017-06-26 MED ORDER — LIDOCAINE HCL (CARDIAC) 20 MG/ML IV SOLN
INTRAVENOUS | Status: DC | PRN
  Administered 2017-06-26: 100 mg via INTRAVENOUS

## 2017-06-26 MED ORDER — FENTANYL CITRATE (PF) 100 MCG/2ML IJ SOLN
50.0000 ug | INTRAMUSCULAR | Status: DC | PRN
  Administered 2017-06-26: 100 ug via INTRAVENOUS

## 2017-06-26 MED ORDER — LACTATED RINGERS IV SOLN
INTRAVENOUS | Status: DC
  Administered 2017-06-26: 12:00:00 via INTRAVENOUS

## 2017-06-26 MED ORDER — DEXAMETHASONE SODIUM PHOSPHATE 4 MG/ML IJ SOLN
INTRAMUSCULAR | Status: DC | PRN
  Administered 2017-06-26: 10 mg via INTRAVENOUS

## 2017-06-26 MED ORDER — HYDROCODONE-ACETAMINOPHEN 5-325 MG PO TABS: 1.0000 | ORAL_TABLET | Freq: Four times a day (QID) | ORAL | 0 refills | Status: DC | PRN

## 2017-06-26 MED ORDER — FENTANYL CITRATE (PF) 100 MCG/2ML IJ SOLN
INTRAMUSCULAR | Status: AC
  Filled 2017-06-26: qty 2

## 2017-06-26 MED ORDER — MIDAZOLAM HCL 2 MG/2ML IJ SOLN
1.0000 mg | INTRAMUSCULAR | Status: DC | PRN
  Administered 2017-06-26: 2 mg via INTRAVENOUS

## 2017-06-26 MED ORDER — ACETAMINOPHEN 500 MG PO TABS
ORAL_TABLET | ORAL | Status: AC
  Filled 2017-06-26: qty 2

## 2017-06-26 MED ORDER — PHENYLEPHRINE HCL 10 MG/ML IJ SOLN
INTRAMUSCULAR | Status: DC | PRN
  Administered 2017-06-26: 40 ug via INTRAVENOUS

## 2017-06-26 MED ORDER — ROCURONIUM BROMIDE 10 MG/ML (PF) SYRINGE
PREFILLED_SYRINGE | INTRAVENOUS | Status: AC
  Filled 2017-06-26: qty 5

## 2017-06-26 MED ORDER — CELECOXIB 200 MG PO CAPS
ORAL_CAPSULE | ORAL | Status: AC
  Filled 2017-06-26: qty 1

## 2017-06-26 MED ORDER — PROPOFOL 10 MG/ML IV BOLUS
INTRAVENOUS | Status: DC | PRN
  Administered 2017-06-26: 200 mg via INTRAVENOUS

## 2017-06-26 MED ORDER — FENTANYL CITRATE (PF) 100 MCG/2ML IJ SOLN: 25.0000 ug | INTRAMUSCULAR | Status: DC | PRN

## 2017-06-26 MED ORDER — LIDOCAINE HCL (CARDIAC) 20 MG/ML IV SOLN
INTRAVENOUS | Status: AC
  Filled 2017-06-26: qty 5

## 2017-06-26 MED ORDER — TECHNETIUM TC 99M SULFUR COLLOID FILTERED: 0.5000 | Freq: Once | INTRAVENOUS | Status: DC | PRN

## 2017-06-26 MED ORDER — ONDANSETRON HCL 4 MG/2ML IJ SOLN
INTRAMUSCULAR | Status: DC | PRN
Start: 2017-06-26 — End: 2017-06-26
  Administered 2017-06-26: 4 mg via INTRAVENOUS

## 2017-06-26 MED ORDER — 0.9 % SODIUM CHLORIDE (POUR BTL) OPTIME
TOPICAL | Status: DC | PRN
  Administered 2017-06-26: 1000 mL

## 2017-06-26 MED ORDER — DEXAMETHASONE SODIUM PHOSPHATE 10 MG/ML IJ SOLN
INTRAMUSCULAR | Status: AC
  Filled 2017-06-26: qty 1

## 2017-06-26 MED ORDER — CELECOXIB 200 MG PO CAPS
200.0000 mg | ORAL_CAPSULE | ORAL | Status: AC
  Administered 2017-06-26: 200 mg via ORAL

## 2017-06-26 MED ORDER — KETOROLAC TROMETHAMINE 30 MG/ML IJ SOLN
INTRAMUSCULAR | Status: DC | PRN
  Administered 2017-06-26: 30 mg via INTRAVENOUS

## 2017-06-26 MED ORDER — GABAPENTIN 300 MG PO CAPS
ORAL_CAPSULE | ORAL | Status: AC
  Filled 2017-06-26: qty 1

## 2017-06-26 SURGICAL SUPPLY — 50 items
APPLIER CLIP 11 MED OPEN (CLIP) ×3
BLADE SURG 10 STRL SS (BLADE) ×3 IMPLANT
BLADE SURG 15 STRL LF DISP TIS (BLADE) ×1 IMPLANT
BLADE SURG 15 STRL SS (BLADE) ×2
CANISTER SUCT 1200ML W/VALVE (MISCELLANEOUS) ×3 IMPLANT
CHLORAPREP W/TINT 26ML (MISCELLANEOUS) ×3 IMPLANT
CLIP APPLIE 11 MED OPEN (CLIP) ×1 IMPLANT
COVER BACK TABLE 60X90IN (DRAPES) ×3 IMPLANT
COVER MAYO STAND STRL (DRAPES) ×3 IMPLANT
COVER PROBE W GEL 5X96 (DRAPES) ×3 IMPLANT
COVER SURGICAL LIGHT HANDLE (MISCELLANEOUS) ×3 IMPLANT
DECANTER SPIKE VIAL GLASS SM (MISCELLANEOUS) ×3 IMPLANT
DERMABOND ADVANCED (GAUZE/BANDAGES/DRESSINGS) ×2
DERMABOND ADVANCED .7 DNX12 (GAUZE/BANDAGES/DRESSINGS) ×1 IMPLANT
DRAIN CHANNEL 19F RND (DRAIN) IMPLANT
DRAPE LAPAROSCOPIC ABDOMINAL (DRAPES) ×3 IMPLANT
DRAPE LAPAROTOMY 100X72 PEDS (DRAPES) ×3 IMPLANT
DRAPE UTILITY XL STRL (DRAPES) ×6 IMPLANT
DRSG TELFA 3X8 NADH (GAUZE/BANDAGES/DRESSINGS) ×3 IMPLANT
ELECT COATED BLADE 2.86 ST (ELECTRODE) ×3 IMPLANT
ELECT REM PT RETURN 9FT ADLT (ELECTROSURGICAL) ×3
ELECTRODE REM PT RTRN 9FT ADLT (ELECTROSURGICAL) ×1 IMPLANT
EVACUATOR SILICONE 100CC (DRAIN) IMPLANT
GLOVE BIO SURGEON STRL SZ7.5 (GLOVE) ×3 IMPLANT
GOWN STRL REUS W/ TWL LRG LVL3 (GOWN DISPOSABLE) ×2 IMPLANT
GOWN STRL REUS W/TWL LRG LVL3 (GOWN DISPOSABLE) ×4
NDL SAFETY ECLIPSE 18X1.5 (NEEDLE) IMPLANT
NEEDLE HYPO 18GX1.5 SHARP (NEEDLE)
NEEDLE HYPO 25X1 1.5 SAFETY (NEEDLE) ×3 IMPLANT
NS IRRIG 1000ML POUR BTL (IV SOLUTION) ×3 IMPLANT
PACK BASIN DAY SURGERY FS (CUSTOM PROCEDURE TRAY) ×3 IMPLANT
PENCIL BUTTON HOLSTER BLD 10FT (ELECTRODE) ×3 IMPLANT
SLEEVE SCD COMPRESS KNEE MED (MISCELLANEOUS) ×3 IMPLANT
SPONGE LAP 18X18 RF (DISPOSABLE) ×3 IMPLANT
STAPLER VISISTAT 35W (STAPLE) ×3 IMPLANT
SUT ETHILON 3 0 FSL (SUTURE) ×3 IMPLANT
SUT MNCRL AB 3-0 PS2 18 (SUTURE) ×3 IMPLANT
SUT MON AB 4-0 PC3 18 (SUTURE) ×3 IMPLANT
SUT SILK 2 0 SH (SUTURE) ×3 IMPLANT
SUT VIC AB 2-0 SH 18 (SUTURE) ×3 IMPLANT
SUT VIC AB 3-0 54X BRD REEL (SUTURE) IMPLANT
SUT VIC AB 3-0 BRD 54 (SUTURE)
SUT VIC AB 3-0 SH 27 (SUTURE) ×2
SUT VIC AB 3-0 SH 27X BRD (SUTURE) ×1 IMPLANT
SYR CONTROL 10ML LL (SYRINGE) ×3 IMPLANT
TOWEL OR 17X24 6PK STRL BLUE (TOWEL DISPOSABLE) ×3 IMPLANT
TOWEL OR NON WOVEN STRL DISP B (DISPOSABLE) ×3 IMPLANT
TUBE CONNECTING 20'X1/4 (TUBING) ×1
TUBE CONNECTING 20X1/4 (TUBING) ×2 IMPLANT
YANKAUER SUCT BULB TIP NO VENT (SUCTIONS) ×3 IMPLANT

## 2017-06-26 NOTE — Interval H&P Note (Signed)
History and Physical Interval Note:  06/26/2017 1:11 PM  Jake [name]  has presented today for surgery, with the diagnosis of melanoma left back  The various methods of treatment have been discussed with the patient and family. After consideration of risks, benefits and other options for treatment, the patient has consented to  Procedure(s): WIDE EXCISION MELANOMA LEFT BACK AND LEFT AXILLARY, SENTINEL NODE MAPPING (Left) as a surgical intervention .  The patient's history has been reviewed, patient examined, no change in status, stable for surgery.  I have reviewed the patient's chart and labs.  Questions were answered to the patient's satisfaction.     TOTH III,Merlinda Wrubel S

## 2017-06-26 NOTE — H&P (Signed)
Jake [name]  Location: [facility] Patient #: [id number] DOB: 02/24/1956 Married / Language: English / Race: Undefined Male   History of Present Illness The patient is a 62 year old male who presents for a melanoma biopsy follow-up. We are asked to see the patient in consultation by Dr. Pearline Cables to evaluate him for melanoma on the left back. The patient is a 62 year old white male who easily went to the hairdresser. At that time she noted several moles on the scalp. He then followed up with a dermatologist who found a worrisome looking mole on his left mid back. This was punch biopsied and came back as a melanoma that was 1.9 mm thick. There was no ulceration. The edges of the punch biopsy were clear. He is otherwise in good health and has no major medical problems.   Past Surgical History No pertinent past surgical history   Diagnostic Studies History  Colonoscopy  never  Allergies  No Known Drug Allergies Allergies Reconciled   Medication History  No Current Medications Medications Reconciled  Social History  Alcohol use  Occasional alcohol use. Caffeine use  Coffee. No drug use   Other Problems  No pertinent past medical history     Review of Systems  General Not Present- Appetite Loss, Chills, Fatigue, Fever, Night Sweats, Weight Gain and Weight Loss. Skin Present- Change in Wart/Mole. Not Present- Dryness, Hives, Jaundice, New Lesions, Non-Healing Wounds, Rash and Ulcer. HEENT Present- Seasonal Allergies, Visual Disturbances and Wears glasses/contact lenses. Not Present- Earache, Hearing Loss, Hoarseness, Nose Bleed, Oral Ulcers, Ringing in the Ears, Sinus Pain, Sore Throat and Yellow Eyes. Respiratory Not Present- Bloody sputum, Chronic Cough, Difficulty Breathing, Snoring and Wheezing. Breast Not Present- Breast Mass, Breast Pain, Nipple Discharge and Skin Changes. Cardiovascular Not Present- Chest Pain, Difficulty Breathing Lying Down, Leg  Cramps, Palpitations, Rapid Heart Rate, Shortness of Breath and Swelling of Extremities. Gastrointestinal Not Present- Abdominal Pain, Bloating, Bloody Stool, Change in Bowel Habits, Chronic diarrhea, Constipation, Difficulty Swallowing, Excessive gas, Gets full quickly at meals, Hemorrhoids, Indigestion, Nausea, Rectal Pain and Vomiting. Male Genitourinary Not Present- Blood in Urine, Change in Urinary Stream, Frequency, Impotence, Nocturia, Painful Urination, Urgency and Urine Leakage.  Vitals  Weight: 220 lb Height: 73in Body Surface Area: 2.24 m Body Mass Index: 29.03 kg/m  Temp.: 97.73F  Pulse: 96 (Regular)  BP: 144/82 (Sitting, Left Arm, Standard)       Physical Exam  General Mental Status-Alert. General Appearance-Consistent with stated age. Hydration-Well hydrated. Voice-Normal.  Integumentary Note: There is a small incision on the left mid back that appears to be healing nicely with no sign of infection. There are no obvious satellite lesions or worrisome-looking moles around this area   Head and Neck Head-normocephalic, atraumatic with no lesions or palpable masses. Trachea-midline. Thyroid Gland Characteristics - normal size and consistency.  Eye Eyeball - Bilateral-Extraocular movements intact. Sclera/Conjunctiva - Bilateral-No scleral icterus.  Chest and Lung Exam Chest and lung exam reveals -quiet, even and easy respiratory effort with no use of accessory muscles and on auscultation, normal breath sounds, no adventitious sounds and normal vocal resonance. Inspection Chest Wall - Normal. Back - normal.  Cardiovascular Cardiovascular examination reveals -normal heart sounds, regular rate and rhythm with no murmurs and normal pedal pulses bilaterally.  Abdomen Inspection Inspection of the abdomen reveals - No Hernias. Skin - Scar - no surgical scars. Palpation/Percussion Palpation and Percussion of the abdomen reveal -  Soft, Non Tender, No Rebound tenderness, No Rigidity (guarding) and  No hepatosplenomegaly. Auscultation Auscultation of the abdomen reveals - Bowel sounds normal.  Neurologic Neurologic evaluation reveals -alert and oriented x 3 with no impairment of recent or remote memory. Mental Status-Normal.  Musculoskeletal Normal Exam - Left-Upper Extremity Strength Normal and Lower Extremity Strength Normal. Normal Exam - Right-Upper Extremity Strength Normal and Lower Extremity Strength Normal.  Lymphatic Head & Neck  General Head & Neck Lymphatics: Bilateral - Description - Normal. Axillary  General Axillary Region: Bilateral - Description - Normal. Tenderness - Non Tender. Femoral & Inguinal  Generalized Femoral & Inguinal Lymphatics: Bilateral - Description - Normal. Tenderness - Non Tender. Note: There is no palpable groin or axillary or supraclavicular or cervical lymphadenopathy.     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 The edges of the punch biopsy were clean. Given the thickness at this point I would recommend a wide excision of the area as well as a sentinel node mapping. We will obtain a preoperative lymphoscintigram to know which lymph node basin we will need to evaluate. I have discussed with him in detail the risks and benefits of the operation as well as the technical aspects and he understands and wishes to proceed. Once we have his final pathology back I will refer him to medical oncology to talk about adjuvant therapy. Current Plans [place] SCAN LYMPHATICS AND LYMPH NODES FOR MELANOMA ([id number]) (lymphoscintogram. area is on left mid back) Pt Education - Melanoma: discussed with patient and provided information.

## 2017-06-26 NOTE — Anesthesia Procedure Notes (Signed)
Procedure Name: Intubation Performed by: Terrance Mass, CRNA Pre-anesthesia Checklist: Patient identified, Emergency Drugs available, Suction available and Patient being monitored Patient Re-evaluated:Patient Re-evaluated prior to induction Oxygen Delivery Method: Circle system utilized Preoxygenation: Pre-oxygenation with 100% oxygen Induction Type: IV induction Ventilation: Mask ventilation without difficulty Laryngoscope Size: Miller and 2 Grade View: Grade I Tube type: Oral Tube size: 7.0 mm Number of attempts: 1 Airway Equipment and Method: Stylet and Oral airway Placement Confirmation: ETT inserted through vocal cords under direct vision,  positive ETCO2 and breath sounds checked- equal and bilateral Secured at: 23 cm Tube secured with: Tape Dental Injury: Teeth and Oropharynx as per pre-operative assessment

## 2017-06-26 NOTE — Anesthesia Postprocedure Evaluation (Signed)
Anesthesia Post Note  Patient: Aramis Weil Bowley  Procedure(s) Performed: WIDE EXCISION MELANOMA LEFT BACK AND LEFT AXILLARY, SENTINEL NODE MAPPING (Left )     Patient location during evaluation: PACU Anesthesia Type: General Level of consciousness: awake and alert Pain management: pain level controlled Vital Signs Assessment: post-procedure vital signs reviewed and stable Respiratory status: spontaneous breathing, nonlabored ventilation and respiratory function stable Cardiovascular status: blood pressure returned to baseline and stable Postop Assessment: no apparent nausea or vomiting Anesthetic complications: no    Last Vitals:  Vitals:   06/26/17 1600 06/26/17 1615  BP: (!) 140/96 (!) 150/100  Pulse: 78   Resp: 14 16  Temp:  (!) 36.1 C  SpO2: 100% 96%    Last Pain:  Vitals:   06/26/17 1615  TempSrc:   PainSc: 0-No pain                 Catalina Gravel

## 2017-06-26 NOTE — Op Note (Signed)
06/26/2017  3:20 PM  PATIENT:  Jake [name]  62 y.o. male  PRE-OPERATIVE DIAGNOSIS:  melanoma left back  POST-OPERATIVE DIAGNOSIS:  melanoma left back  PROCEDURE:  Procedure(s): WIDE EXCISION MELANOMA LEFT BACK AND DEEP LEFT AXILLARY SENTINEL NODE MAPPING (Left)  SURGEON:  Surgeon(s) and Role:    * Jovita Kussmaul, MD - Primary  PHYSICIAN ASSISTANT:   ASSISTANTS: none   ANESTHESIA:   local and general  EBL:  10 mL   BLOOD ADMINISTERED:none  DRAINS: none   LOCAL MEDICATIONS USED:  MARCAINE     SPECIMEN:  Source of Specimen:  melanoma left back and sentinel nodes X 4  DISPOSITION OF SPECIMEN:  PATHOLOGY  COUNTS:  YES  TOURNIQUET:  * No tourniquets in log *  DICTATION: .Dragon Dictation   After informed consent was obtained the patient was brought to the operating room and placed in the supine position on the stretcher.  After adequate induction of general anesthesia the patient was moved into a prone position on the operating room table and all pressure points were padded.  The left back area was prepped with ChloraPrep, allowed to dry, and draped in usual sterile manner.  An appropriate timeout was performed.  Earlier in the day the patient underwent injection of 1 mCi of technetium sulfur colloid in the dermal area around the site of previous melanoma excision on the left back.  At this point I mapped out 2 cm circumferentially from the previous scar.  The area around this was infiltrated with quarter percent Marcaine.  I then made an elliptical incision around the previous scar that would encompass this 2 cm area.  This was done with a 15 blade knife.  The incision was carried through the skin and subcutaneous tissue sharply with the electrocautery and the dissection was carried all the way to the muscles of the back.  Once the entire specimen was removed it was oriented with a short stitch on the superior surface and a long stitch on the lateral surface.  The specimen was  then sent to pathology for further evaluation.  Hemostasis was achieved using the Bovie electrocautery.  The superior and inferior skin and subcutaneous tissue was mobilized by undermining it just above the fascia of the muscles.  This was done circumferentially.  I then closed the deep layer of the wound with 2 layers of 2-0 Vicryl stitches.  The skin was then closed with interrupted 3-0 Monocryl subcuticular stitches.  Dermabond dressings were then applied.  The patient tolerated this portion of the procedure well.  When the dressing was dry we then moved him into a supine position on the stretcher and then moved him back on to the operating room bed.  The left axillary space was then prepped with ChloraPrep, allowed to dry, and draped in usual sterile manner.  An appropriate timeout was performed.  The neoprobe was set to technetium in an area of radioactivity was readily identified in the left axilla.  This area was infiltrated with quarter percent Marcaine.  A small incision was made with a 15 blade knife overlying the area of radioactivity.  The incision was carried through the skin and subcutaneous tissue sharply with the electrocautery until the deep left axillary space was entered.  The neoprobe was used to direct blunt hemostat dissection.  I was able to identify 3 hot lymph nodes and one palpable lymph node.  These were excised sharply with the electrocautery and the lymphatics were controlled with clips.  Ex vivo counts on these nodes ranged from 350 to 3500.  These were sent as sentinel nodes numbers 1 through 4.  The area was examined and found to be hemostatic.  No other hot or palpable lymph nodes were identified in the left axilla.  The deep layer of the wound was then closed with interrupted 3-0 Vicryl stitches.  The skin was then closed with a running 4-0 Monocryl subcuticular stitch.  Dermabond dressings were applied.  The patient tolerated the procedure well.  At the end of the case all needle  sponge and instrument counts were correct.  The patient was then awakened and taken to recovery in stable condition.  PLAN OF CARE: Discharge to home after PACU  PATIENT DISPOSITION:  PACU - hemodynamically stable.   Delay start of Pharmacological VTE agent (>24hrs) due to surgical blood loss or risk of bleeding: not applicable

## 2017-06-26 NOTE — Transfer of Care (Signed)
Immediate Anesthesia Transfer of Care Note  Patient: Jake [name]  Procedure(s) Performed: WIDE EXCISION MELANOMA LEFT BACK AND LEFT AXILLARY, SENTINEL NODE MAPPING (Left )  Patient Location: PACU  Anesthesia Type:General  Level of Consciousness: awake and sedated  Airway & Oxygen Therapy: Patient Spontanous Breathing and Patient connected to face mask oxygen  Post-op Assessment: Report given to RN and Post -op Vital signs reviewed and stable  Post vital signs: Reviewed and stable  Last Vitals:  Vitals Value Taken Time  BP 128/84 06/26/2017  3:34 PM  Temp    Pulse 89 06/26/2017  3:37 PM  Resp 15 06/26/2017  3:37 PM  SpO2 100 % 06/26/2017  3:37 PM  Vitals shown include unvalidated device data.  Last Pain:  Vitals:   06/26/17 1147  TempSrc: Oral  PainSc: 0-No pain      Patients Stated Pain Goal: 0 (43/15/40 0867)  Complications: No apparent anesthesia complications

## 2017-06-26 NOTE — Anesthesia Preprocedure Evaluation (Signed)
Anesthesia Evaluation  Patient identified by MRN, date of birth, ID band Patient awake  General Assessment Comment:Left back melanoma   Reviewed: Allergy & Precautions, NPO status , Patient's Chart, lab work & pertinent test results  Airway Mallampati: II  TM Distance: >3 FB Neck ROM: Full    Dental  (+) Teeth Intact, Dental Advisory Given   Pulmonary former smoker,    Pulmonary exam normal breath sounds clear to auscultation       Cardiovascular hypertension, Normal cardiovascular exam Rhythm:Regular Rate:Normal     Neuro/Psych negative neurological ROS  negative psych ROS   GI/Hepatic negative GI ROS, Neg liver ROS,   Endo/Other  negative endocrine ROS  Renal/GU negative Renal ROS     Musculoskeletal negative musculoskeletal ROS (+)   Abdominal   Peds  Hematology negative hematology ROS (+)   Anesthesia Other Findings Day of surgery medications reviewed with the patient.  Reproductive/Obstetrics                             Anesthesia Physical Anesthesia Plan  ASA: II  Anesthesia Plan: General   Post-op Pain Management:    Induction: Intravenous  PONV Risk Score and Plan: 2 and Dexamethasone, Ondansetron and Midazolam  Airway Management Planned: Oral ETT  Additional Equipment:   Intra-op Plan:   Post-operative Plan: Extubation in OR  Informed Consent: I have reviewed the patients History and Physical, chart, labs and discussed the procedure including the risks, benefits and alternatives for the proposed anesthesia with the patient or authorized representative who has indicated his/her understanding and acceptance.   Dental advisory given  Plan Discussed with: CRNA  Anesthesia Plan Comments:         Anesthesia Quick Evaluation

## 2017-06-26 NOTE — Progress Notes (Signed)
Emotional support during injections 

## 2017-06-26 NOTE — Discharge Instructions (Signed)

## 2017-06-27 ENCOUNTER — Encounter (HOSPITAL_BASED_OUTPATIENT_CLINIC_OR_DEPARTMENT_OTHER): Payer: Self-pay | Admitting: General Surgery

## 2017-07-14 DIAGNOSIS — C439 Malignant melanoma of skin, unspecified: Secondary | ICD-10-CM | POA: Insufficient documentation

## 2019-04-09 IMAGING — NM NM LYMPHATICS/LYMPH NODE
6 series · 6 of 6 positions shown · non-contrast
Comparison: None.

CLINICAL DATA: 62-year-old male with melanoma of the back

EXAM:
NUCLEAR MEDICINE LYMPHANGIOGRAPHY
TECHNIQUE: Sequential images were obtained following intradermal injection of
radiopharmaceutical at the tumor site in the central LEFT back.
RADIOPHARMACEUTICALS:  0.5 mCi millipore-filtered Tc-HHm sulfur
colloid

[Series 1: lymph ant/post · 2.07mm/px · 1 of 1 slices shown]
[im 1/1]
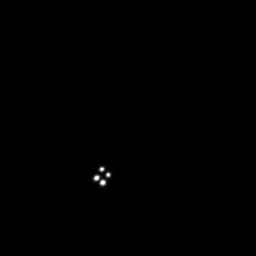

[Series 2: lymph lats · 2.07mm/px · 1 of 1 slices shown]
[im 1/1]
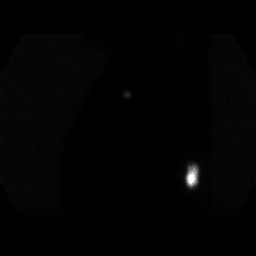

[Series 3: lymph ant/post 2 · 2.07mm/px · 1 of 1 slices shown]
[im 1/1]
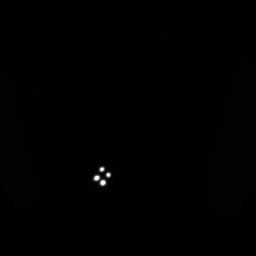

[Series 5: lymph lats 3 · 2.07mm/px · 1 of 1 slices shown]
[im 1/1]
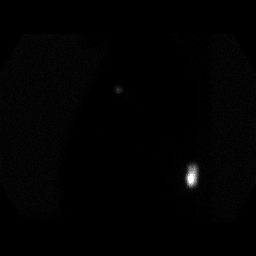

[Series 6: lymph ant/post 3 · 2.07mm/px · 1 of 1 slices shown]
[im 1/1  full-range]
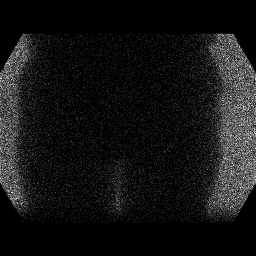

[Series 7: lymph ant/post 4 · 2.07mm/px · 1 of 1 slices shown]
[im 1/1  full-range]
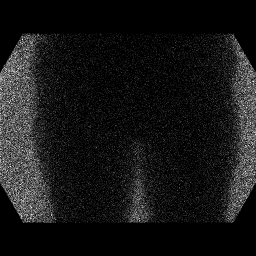

[6 of 6 positions shown; findings below may reference images not displayed]

FINDINGS: Injection site noted in the central LEFT back. Lymph node mass to
the LEFT axilla by first image series (15 minutes). No inguinal
lymph node mapping
IMPRESSION: Sentinel lymph node mapping to the LEFT axilla.

## 2019-09-06 ENCOUNTER — Telehealth: Payer: Self-pay | Admitting: General Practice

## 2019-09-06 NOTE — Telephone Encounter (Signed)
Called patient and he wanting to wait until the end of August for an appointment since he was switching insurance plans. He said that he would be calling back within the next month.

## 2019-09-06 NOTE — Telephone Encounter (Signed)
Patient was wondering if he could reestablish care with Dr. Ronnald Ramp. He was last seen 02/13/2015. Please advise.

## 2021-10-23 ENCOUNTER — Encounter: Payer: Self-pay | Admitting: Internal Medicine

## 2021-10-23 ENCOUNTER — Other Ambulatory Visit (INDEPENDENT_AMBULATORY_CARE_PROVIDER_SITE_OTHER): Payer: BLUE CROSS/BLUE SHIELD

## 2021-10-23 ENCOUNTER — Ambulatory Visit: Payer: 59 | Admitting: Internal Medicine

## 2021-10-23 VITALS — BP 158/96 | HR 75 | Temp 98.6°F | Ht 73.0 in | Wt 193.0 lb

## 2021-10-23 DIAGNOSIS — Z0001 Encounter for general adult medical examination with abnormal findings: Secondary | ICD-10-CM | POA: Diagnosis not present

## 2021-10-23 DIAGNOSIS — Z0000 Encounter for general adult medical examination without abnormal findings: Secondary | ICD-10-CM

## 2021-10-23 DIAGNOSIS — I10 Essential (primary) hypertension: Secondary | ICD-10-CM | POA: Diagnosis not present

## 2021-10-23 DIAGNOSIS — Z1211 Encounter for screening for malignant neoplasm of colon: Secondary | ICD-10-CM

## 2021-10-23 NOTE — Progress Notes (Unsigned)
Subjective:  Patient ID: Jake [name], male    DOB: 1955-09-04  Age: 66 y.o. MRN: [id number]  CC: Annual Exam   HPI Jake [name] presents for a CPX and to re-establish.  Jake [name] is very active and denies chest pain, shortness of breath, diaphoresis, dizziness, lightheadedness, or edema.  History Jake [name] has a past medical history of Macular degeneration.   Jake [name] has a past surgical history that includes Hernia repair and Melanoma excision with sentinel lymph node dissection (Left, 06/26/2017).   His family history includes Heart disease in his mother; Hypertension in his brother.Jake [name] reports that Jake [name] has quit smoking. His smoking use included cigarettes. Jake [name] has a 6.25 pack-year smoking history. Jake [name] has never used smokeless tobacco. Jake [name] reports current alcohol use of about 5.0 standard drinks of alcohol per week. Jake [name] reports current drug use. Drug: Marijuana.  Outpatient Medications Prior to Visit  Medication Sig Dispense Refill   fluticasone (FLONASE) 50 MCG/ACT nasal spray Place 2 sprays into both nostrils daily. (Patient not taking: Reported on 10/23/2021) 16 g 11   HYDROcodone-acetaminophen (NORCO/VICODIN) 5-325 MG tablet Take 1-2 tablets by mouth every 6 (six) hours as needed for moderate pain or severe pain. (Patient not taking: Reported on 10/23/2021) 15 tablet 0   Multiple Vitamins-Minerals (CENTRUM SILVER ADULT 50+) TABS Take by mouth. (Patient not taking: Reported on 10/23/2021)     Tobramycin-Dexamethasone 0.3-0.05 % SUSP Apply 1 drop to eye 4 (four) times daily. (Patient not taking: Reported on 10/23/2021) 5 mL 0   No facility-administered medications prior to visit.    ROS Review of Systems  Constitutional: Negative.  Negative for diaphoresis and fatigue.  HENT: Negative.    Eyes: Negative.   Respiratory:  Negative for cough, shortness of breath and wheezing.   Cardiovascular:  Negative for chest pain, palpitations and leg swelling.  Gastrointestinal:  Negative for abdominal pain,  constipation, diarrhea, nausea and vomiting.  Endocrine: Negative.   Genitourinary: Negative.  Negative for difficulty urinating, hematuria, scrotal swelling and testicular pain.  Musculoskeletal:  Negative for arthralgias, back pain, myalgias and neck pain.  Skin: Negative.   Neurological: Negative.  Negative for dizziness, weakness and light-headedness.  Hematological:  Negative for adenopathy. Does not bruise/bleed easily.  Psychiatric/Behavioral: Negative.      Objective:  BP (!) 158/96   Pulse 75   Temp 98.6 F (37 C) (Oral)   Ht '6\' 1"'$  (1.854 m)   Wt 193 lb (87.5 kg)   SpO2 99%   BMI 25.46 kg/m   Physical Exam Vitals reviewed.  HENT:     Nose: Nose normal.     Mouth/Throat:     Mouth: Mucous membranes are moist.  Eyes:     General: No scleral icterus.    Conjunctiva/sclera: Conjunctivae normal.  Cardiovascular:     Rate and Rhythm: Normal rate and regular rhythm.     Heart sounds: Normal heart sounds, S1 normal and S2 normal. No murmur heard.    Comments: EKG- NSR, 72 bpm No LVH Normal EKG Pulmonary:     Effort: Pulmonary effort is normal.     Breath sounds: No stridor. No wheezing, rhonchi or rales.  Abdominal:     General: Abdomen is flat.     Palpations: There is no mass.     Tenderness: There is no abdominal tenderness. There is no guarding.     Hernia: No hernia is present. There is no hernia in the left inguinal area or right inguinal area.  Genitourinary:  Pubic Area: No rash.      Penis: Normal and circumcised.      Testes: Normal.     Epididymis:     Right: Normal.     Left: Normal.     Prostate: Normal. Not enlarged, not tender and no nodules present.     Rectum: Normal. Guaiac result negative. No mass, tenderness, anal fissure, external hemorrhoid or internal hemorrhoid. Normal anal tone.  Musculoskeletal:        General: Normal range of motion.     Cervical back: Neck supple.     Right lower leg: No edema.     Left lower leg: No edema.   Lymphadenopathy:     Cervical: No cervical adenopathy.     Lower Body: No right inguinal adenopathy. No left inguinal adenopathy.  Skin:    General: Skin is warm and dry.  Neurological:     General: No focal deficit present.     Mental Status: Jake [name] is alert. Mental status is at baseline.  Psychiatric:        Mood and Affect: Mood normal.        Behavior: Behavior normal.     Lab Results  Component Value Date   WBC 4.2 06/26/2017   HGB 14.5 06/26/2017   HCT 42.1 06/26/2017   PLT 134 (L) 06/26/2017   GLUCOSE 88 10/23/2021   CHOL 179 10/23/2021   TRIG 183.0 (H) 10/23/2021   HDL 75.40 10/23/2021   LDLCALC 67 10/23/2021   ALT 16 10/23/2021   AST 13 10/23/2021   NA 140 10/23/2021   K 3.9 10/23/2021   CL 107 10/23/2021   CREATININE 1.08 10/23/2021   BUN 18 10/23/2021   CO2 26 10/23/2021   TSH 2.06 10/23/2021   PSA 1.07 10/23/2021     Assessment & Plan:   Jake [name] was seen today for annual exam.  Diagnoses and all orders for this visit:  Essential hypertension, benign- His blood pressure is not at the goal of 130/80.  His labs and EKG are negative for secondary causes and endorgan damage.  Will treat with the combination of a CCB and ARB. -     Basic metabolic panel; Future -     Aldosterone + renin activity w/ ratio; Future -     TSH; Future -     Urinalysis, Routine w reflex microscopic; Future -     Hepatic function panel; Future -     CBC with Differential/Platelet; Future -     amLODipine-olmesartan (AZOR) 5-20 MG tablet; Take 1 tablet by mouth daily.  Screen for colon cancer -     Cologuard  Routine general medical examination at a health care facility- Exam completed, labs reviewed-statin therapy is not indicated, vaccines are up-to-date, cancer screenings addressed, patient education was given. -     Lipid panel; Future -     PSA; Future -     HIV Antibody (routine testing w rflx); Future  Hypertension, unspecified type -     EKG 12-Lead   I am having Jake  M. [name] start on amLODipine-olmesartan. I am also having Jake [name] maintain his Centrum Silver Adult 50+, Tobramycin-Dexamethasone, fluticasone, and HYDROcodone-acetaminophen.  Meds ordered this encounter  Medications   amLODipine-olmesartan (AZOR) 5-20 MG tablet    Sig: Take 1 tablet by mouth daily.    Dispense:  90 tablet    Refill:  0     Follow-up: Return in about 3 months (around 01/23/2022).  Scarlette Calico, MD

## 2021-10-23 NOTE — Patient Instructions (Signed)
Health Maintenance, Male Adopting a healthy lifestyle and getting preventive care are important in promoting health and wellness. Ask your health care provider about: The right schedule for you to have regular tests and exams. Things you can do on your own to prevent diseases and keep yourself healthy. What should I know about diet, weight, and exercise? Eat a healthy diet  Eat a diet that includes plenty of vegetables, fruits, low-fat dairy products, and lean protein. Do not eat a lot of foods that are high in solid fats, added sugars, or sodium. Maintain a healthy weight Body mass index (BMI) is a measurement that can be used to identify possible weight problems. It estimates body fat based on height and weight. Your health care provider can help determine your BMI and help you achieve or maintain a healthy weight. Get regular exercise Get regular exercise. This is one of the most important things you can do for your health. Most adults should: Exercise for at least 150 minutes each week. The exercise should increase your heart rate and make you sweat (moderate-intensity exercise). Do strengthening exercises at least twice a week. This is in addition to the moderate-intensity exercise. Spend less time sitting. Even light physical activity can be beneficial. Watch cholesterol and blood lipids Have your blood tested for lipids and cholesterol at 64 years of age, then have this test every 5 years. You may need to have your cholesterol levels checked more often if: Your lipid or cholesterol levels are high. You are older than 64 years of age. You are at high risk for heart disease. What should I know about cancer screening? Many types of cancers can be detected early and may often be prevented. Depending on your health history and family history, you may need to have cancer screening at various ages. This may include screening for: Colorectal cancer. Prostate cancer. Skin cancer. Lung  cancer. What should I know about heart disease, diabetes, and high blood pressure? Blood pressure and heart disease High blood pressure causes heart disease and increases the risk of stroke. This is more likely to develop in people who have high blood pressure readings or are overweight. Talk with your health care provider about your target blood pressure readings. Have your blood pressure checked: Every 3-5 years if you are 18-39 years of age. Every year if you are 40 years old or older. If you are between the ages of 65 and 75 and are a current or former smoker, ask your health care provider if you should have a one-time screening for abdominal aortic aneurysm (AAA). Diabetes Have regular diabetes screenings. This checks your fasting blood sugar level. Have the screening done: Once every three years after age 45 if you are at a normal weight and have a low risk for diabetes. More often and at a younger age if you are overweight or have a high risk for diabetes. What should I know about preventing infection? Hepatitis B If you have a higher risk for hepatitis B, you should be screened for this virus. Talk with your health care provider to find out if you are at risk for hepatitis B infection. Hepatitis C Blood testing is recommended for: Everyone born from 1945 through 1965. Anyone with known risk factors for hepatitis C. Sexually transmitted infections (STIs) You should be screened each year for STIs, including gonorrhea and chlamydia, if: You are sexually active and are younger than 64 years of age. You are older than 64 years of age and your   health care provider tells you that you are at risk for this type of infection. Your sexual activity has changed since you were last screened, and you are at increased risk for chlamydia or gonorrhea. Ask your health care provider if you are at risk. Ask your health care provider about whether you are at high risk for HIV. Your health care provider  may recommend a prescription medicine to help prevent HIV infection. If you choose to take medicine to prevent HIV, you should first get tested for HIV. You should then be tested every 3 months for as long as you are taking the medicine. Follow these instructions at home: Alcohol use Do not drink alcohol if your health care provider tells you not to drink. If you drink alcohol: Limit how much you have to 0-2 drinks a day. Know how much alcohol is in your drink. In the [place] one drink equals one 12 oz bottle of beer (355 mL), one 5 oz glass of wine (148 mL), or one 1 oz glass of hard liquor (44 mL). Lifestyle Do not use any products that contain nicotine or tobacco. These products include cigarettes, chewing tobacco, and vaping devices, such as e-cigarettes. If you need help quitting, ask your health care provider. Do not use street drugs. Do not share needles. Ask your health care provider for help if you need support or information about quitting drugs. General instructions Schedule regular health, dental, and eye exams. Stay current with your vaccines. Tell your health care provider if: You often feel depressed. You have ever been abused or do not feel safe at home. Summary Adopting a healthy lifestyle and getting preventive care are important in promoting health and wellness. Follow your health care provider's instructions about healthy diet, exercising, and getting tested or screened for diseases. Follow your health care provider's instructions on monitoring your cholesterol and blood pressure. This information is not intended to replace advice given to you by your health care provider. Make sure you discuss any questions you have with your health care provider. Document Revised: 08/07/2020 Document Reviewed: 08/07/2020 Elsevier Patient Education  2023 [employer]  

## 2021-10-24 LAB — URINALYSIS, ROUTINE W REFLEX MICROSCOPIC
Bilirubin Urine: NEGATIVE
Hgb urine dipstick: NEGATIVE
Ketones, ur: NEGATIVE
Leukocytes,Ua: NEGATIVE
Nitrite: NEGATIVE
RBC / HPF: NONE SEEN (ref 0–?)
Specific Gravity, Urine: >=1.03 — AB (ref 1.000–1.030)
Total Protein, Urine: NEGATIVE
Urine Glucose: NEGATIVE
Urobilinogen, UA: 0.2 (ref 0.0–1.0)
WBC, UA: NONE SEEN (ref 0–?)
pH: 5.5 (ref 5.0–8.0)

## 2021-10-24 LAB — LIPID PANEL
Cholesterol: 179 mg/dL (ref 0–200)
HDL: 75.4 mg/dL (ref >39.00)
LDL Cholesterol: 67 mg/dL (ref 0–99)
NonHDL: 103.77
Total CHOL/HDL Ratio: 2
Triglycerides: 183 mg/dL — ABNORMAL HIGH (ref 0.0–149.0)
VLDL: 36.6 mg/dL (ref 0.0–40.0)

## 2021-10-24 LAB — PSA: PSA: 1.07 ng/mL (ref 0.10–4.00)

## 2021-10-24 LAB — BASIC METABOLIC PANEL
BUN: 18 mg/dL (ref 6–23)
CO2: 26 mEq/L (ref 19–32)
Calcium: 9 mg/dL (ref 8.4–10.5)
Chloride: 107 mEq/L (ref 96–112)
Creatinine, Ser: 1.08 mg/dL (ref 0.40–1.50)
GFR: 72.53 mL/min (ref >60.00)
Glucose, Bld: 88 mg/dL (ref 70–99)
Potassium: 3.9 mEq/L (ref 3.5–5.1)
Sodium: 140 mEq/L (ref 135–145)

## 2021-10-24 LAB — HEPATIC FUNCTION PANEL
ALT: 16 U/L (ref 0–53)
AST: 13 U/L (ref 0–37)
Albumin: 4.4 g/dL (ref 3.5–5.2)
Alkaline Phosphatase: 79 U/L (ref 39–117)
Bilirubin, Direct: 0.1 mg/dL (ref 0.0–0.3)
Total Bilirubin: 0.5 mg/dL (ref 0.2–1.2)
Total Protein: 6.5 g/dL (ref 6.0–8.3)

## 2021-10-24 LAB — TSH: TSH: 2.06 u[IU]/mL (ref 0.35–5.50)

## 2021-10-26 MED ORDER — AMLODIPINE-OLMESARTAN 5-20 MG PO TABS: 1.0000 | ORAL_TABLET | Freq: Every day | ORAL | 0 refills | Status: DC

## 2021-10-30 ENCOUNTER — Telehealth: Payer: Self-pay | Admitting: Internal Medicine

## 2021-10-30 ENCOUNTER — Other Ambulatory Visit: Payer: Self-pay | Admitting: Internal Medicine

## 2021-10-30 DIAGNOSIS — I10 Essential (primary) hypertension: Secondary | ICD-10-CM

## 2021-10-30 LAB — ALDOSTERONE + RENIN ACTIVITY W/ RATIO
ALDO / PRA Ratio: 4.3 Ratio (ref 0.9–28.9)
Aldosterone: 2 ng/dL
Renin Activity: 0.46 ng/mL/h (ref 0.25–5.82)

## 2021-10-30 MED ORDER — AMLODIPINE-OLMESARTAN 5-20 MG PO TABS: 1.0000 | ORAL_TABLET | Freq: Every day | ORAL | 0 refills | Status: DC

## 2021-10-30 NOTE — Telephone Encounter (Signed)
Pharmacy called and stated that pt was confused about the new Rx change. Pt is requesting a callback to explain the change.  The insurance is only covering that medication with  [employer] [facility] [place], [place]  Please advise

## 2021-11-02 ENCOUNTER — Other Ambulatory Visit: Payer: Self-pay | Admitting: Internal Medicine

## 2021-11-02 DIAGNOSIS — I10 Essential (primary) hypertension: Secondary | ICD-10-CM

## 2021-11-02 MED ORDER — AMLODIPINE BESYLATE 5 MG PO TABS: 5.0000 mg | ORAL_TABLET | Freq: Every day | ORAL | 0 refills | Status: DC

## 2021-11-02 MED ORDER — OLMESARTAN MEDOXOMIL 20 MG PO TABS: 20.0000 mg | ORAL_TABLET | Freq: Every day | ORAL | 0 refills | Status: DC

## 2021-11-08 LAB — COLOGUARD: Cologuard: NEGATIVE

## 2021-11-09 LAB — COLOGUARD: COLOGUARD: NEGATIVE

## 2021-11-13 ENCOUNTER — Other Ambulatory Visit (INDEPENDENT_AMBULATORY_CARE_PROVIDER_SITE_OTHER): Payer: 59

## 2021-11-13 DIAGNOSIS — Z0000 Encounter for general adult medical examination without abnormal findings: Secondary | ICD-10-CM

## 2021-11-13 DIAGNOSIS — I10 Essential (primary) hypertension: Secondary | ICD-10-CM | POA: Diagnosis not present

## 2021-11-13 LAB — CBC WITH DIFFERENTIAL/PLATELET
Basophils Absolute: 0 10*3/uL (ref 0.0–0.1)
Basophils Relative: 0.7 % (ref 0.0–3.0)
Eosinophils Absolute: 0.2 10*3/uL (ref 0.0–0.7)
Eosinophils Relative: 4.9 % (ref 0.0–5.0)
HCT: 43.4 % (ref 39.0–52.0)
Hemoglobin: 14.5 g/dL (ref 13.0–17.0)
Lymphocytes Relative: 41.6 % (ref 12.0–46.0)
Lymphs Abs: 2.1 10*3/uL (ref 0.7–4.0)
MCHC: 33.4 g/dL (ref 30.0–36.0)
MCV: 94.6 fl (ref 78.0–100.0)
Monocytes Absolute: 0.4 10*3/uL (ref 0.1–1.0)
Monocytes Relative: 7.8 % (ref 3.0–12.0)
Neutro Abs: 2.3 10*3/uL (ref 1.4–7.7)
Neutrophils Relative %: 45 % (ref 43.0–77.0)
Platelets: 132 10*3/uL — ABNORMAL LOW (ref 150.0–400.0)
RBC: 4.59 Mil/uL (ref 4.22–5.81)
RDW: 13.2 % (ref 11.5–15.5)
WBC: 5 10*3/uL (ref 4.0–10.5)

## 2021-11-14 LAB — HIV ANTIBODY (ROUTINE TESTING W REFLEX): HIV 1&2 Ab, 4th Generation: NONREACTIVE

## 2021-12-31 ENCOUNTER — Telehealth: Payer: Self-pay

## 2021-12-31 NOTE — Telephone Encounter (Signed)
ERROR

## 2022-01-08 ENCOUNTER — Other Ambulatory Visit: Payer: Self-pay | Admitting: Internal Medicine

## 2022-01-08 DIAGNOSIS — I10 Essential (primary) hypertension: Secondary | ICD-10-CM

## 2022-02-04 ENCOUNTER — Telehealth: Payer: Self-pay | Admitting: Internal Medicine

## 2022-02-04 ENCOUNTER — Other Ambulatory Visit: Payer: Self-pay | Admitting: Internal Medicine

## 2022-02-04 DIAGNOSIS — I10 Essential (primary) hypertension: Secondary | ICD-10-CM

## 2022-02-04 MED ORDER — AMLODIPINE BESYLATE 5 MG PO TABS: 5.0000 mg | ORAL_TABLET | Freq: Every day | ORAL | 0 refills | Status: DC

## 2022-02-04 MED ORDER — OLMESARTAN MEDOXOMIL 20 MG PO TABS: 20.0000 mg | ORAL_TABLET | Freq: Every day | ORAL | 0 refills | Status: DC

## 2022-02-04 NOTE — Telephone Encounter (Signed)
Patient came in wondering if he could get an early refill on 2 medications before he has his appt with Dr. Ronnald Ramp on 11/15. He states he will run out before his scheduled appt and that medication has really help his blood pressure. Patient would like to be called about if the medicines will be refilled or not.   The two medications are: amLODipine (NORVASC) 5 MG tablet  olmesartan (BENICAR) 20 MG tablet   He would like for it to be sent to: [place], [place]

## 2022-02-05 NOTE — Telephone Encounter (Signed)
Patient called back and said that he thinks the '20mg'$  olmesartan was to high and thought it was supposed to be '5mg'$ 

## 2022-02-06 NOTE — Telephone Encounter (Signed)
Spoke to pt, he stated that he was misinformed at the pharmacy. They did not have his correct insurance card on file to run his insurance.

## 2022-02-12 DIAGNOSIS — B353 Tinea pedis: Secondary | ICD-10-CM | POA: Diagnosis not present

## 2022-02-12 DIAGNOSIS — L570 Actinic keratosis: Secondary | ICD-10-CM | POA: Diagnosis not present

## 2022-02-12 DIAGNOSIS — L821 Other seborrheic keratosis: Secondary | ICD-10-CM | POA: Diagnosis not present

## 2022-02-12 DIAGNOSIS — L814 Other melanin hyperpigmentation: Secondary | ICD-10-CM | POA: Diagnosis not present

## 2022-02-12 DIAGNOSIS — D225 Melanocytic nevi of trunk: Secondary | ICD-10-CM | POA: Diagnosis not present

## 2022-02-13 ENCOUNTER — Ambulatory Visit: Payer: BC Managed Care – PPO | Admitting: Internal Medicine

## 2022-02-13 ENCOUNTER — Encounter: Payer: Self-pay | Admitting: Internal Medicine

## 2022-02-13 VITALS — BP 132/86 | HR 82 | Temp 98.1°F | Resp 16 | Ht 73.0 in | Wt 203.4 lb

## 2022-02-13 DIAGNOSIS — Z23 Encounter for immunization: Secondary | ICD-10-CM | POA: Diagnosis not present

## 2022-02-13 DIAGNOSIS — I10 Essential (primary) hypertension: Secondary | ICD-10-CM

## 2022-02-13 DIAGNOSIS — D696 Thrombocytopenia, unspecified: Secondary | ICD-10-CM | POA: Diagnosis not present

## 2022-02-13 LAB — CBC WITH DIFFERENTIAL/PLATELET
Basophils Absolute: 0 10*3/uL (ref 0.0–0.1)
Basophils Relative: 0.6 % (ref 0.0–3.0)
Eosinophils Absolute: 0.2 10*3/uL (ref 0.0–0.7)
Eosinophils Relative: 4.8 % (ref 0.0–5.0)
HCT: 42.6 % (ref 39.0–52.0)
Hemoglobin: 14.4 g/dL (ref 13.0–17.0)
Lymphocytes Relative: 32.6 % (ref 12.0–46.0)
Lymphs Abs: 1.3 10*3/uL (ref 0.7–4.0)
MCHC: 33.8 g/dL (ref 30.0–36.0)
MCV: 95.8 fl (ref 78.0–100.0)
Monocytes Absolute: 0.3 10*3/uL (ref 0.1–1.0)
Monocytes Relative: 8.2 % (ref 3.0–12.0)
Neutro Abs: 2.2 10*3/uL (ref 1.4–7.7)
Neutrophils Relative %: 53.8 % (ref 43.0–77.0)
Platelets: 162 10*3/uL (ref 150.0–400.0)
RBC: 4.45 Mil/uL (ref 4.22–5.81)
RDW: 13 % (ref 11.5–15.5)
WBC: 4 10*3/uL (ref 4.0–10.5)

## 2022-02-13 LAB — BASIC METABOLIC PANEL
BUN: 20 mg/dL (ref 6–23)
CO2: 28 mEq/L (ref 19–32)
Calcium: 8.8 mg/dL (ref 8.4–10.5)
Chloride: 108 mEq/L (ref 96–112)
Creatinine, Ser: 1.04 mg/dL (ref 0.40–1.50)
GFR: 75.73 mL/min (ref >60.00)
Glucose, Bld: 111 mg/dL — ABNORMAL HIGH (ref 70–99)
Potassium: 4.1 mEq/L (ref 3.5–5.1)
Sodium: 143 mEq/L (ref 135–145)

## 2022-02-13 LAB — VITAMIN B12: Vitamin B-12: 229 pg/mL (ref 211–911)

## 2022-02-13 LAB — FOLATE: Folate: 9 ng/mL (ref >5.9)

## 2022-02-13 NOTE — Patient Instructions (Signed)
Thrombocytopenia Thrombocytopenia is a condition in which there are a low number of platelets in the blood. Platelets are also called thrombocytes. Platelets are parts of blood that stick together and form a clot to help the body stop bleeding after an injury. If you have too few platelets, your blood may have trouble clotting. This may cause you to bleed and bruise very easily. Some cases of thrombocytopenia are mild while others are more severe. What are the causes? This condition is caused by a low number of platelets in your blood. There are three main reasons for this: Your body not making enough platelets. This may be caused by: Bone marrow diseases. This include aplastic anemia, leukemia, and myelodysplastic anemia. Congenital thrombocytopenia. This is a condition that is passed from parent to child (inherited). Certain cancer treatments, including chemotherapy and radiation therapy. Infections from bacteria or viruses. Alcohol use disorder and alcoholism. Platelets not being released in the blood. This is called platelet sequestration and it can happen due to: An overactive spleen (hypersplenism). The spleen gathers up platelets from circulation, meaning that the platelets are not available to help with clotting your blood. The spleen can be enlarged because of scarring or other conditions. Gaucher disease. Your body destroying platelets too quickly. This may be caused by: An autoimmune disease that causes immune thrombocytopenia (ITP). ITP is sometimes associated with other autoimmune conditions such as lupus. Certain medicines, such as blood thinners. Certain blood clotting or bleeding disorders. Exposure to toxic chemicals, such as pesticides, lead, benzene, and arsenic. Pregnancy. What are the signs or symptoms? Symptoms of this condition are the result of poor blood clotting. They will vary depending on how low the platelet counts are. Symptoms may include: Bruising  easily. Bleeding from the mouth or nose. Heavy menstrual periods. Blood in the urine, stool (feces), or vomit. Purplish-red discolorations on the skin (purpura). A rash that looks like pinpoint, purplish-red spots (petechiae) on the lower legs. How is this diagnosed?  This condition may be diagnosed with blood tests and a physical exam. You may also have other tests, including: A sample of bone marrow (biopsy) may be removed to look for the original cells that make platelets. An ultrasound or CT scan of the abdomen to check for an enlarged spleen, enlarged lymph nodes, or liver problems. How is this treated? Treatment for this condition depends on the cause. Treatment may include: Treatment of another condition that is causing the low platelet count. Medicines to help protect your platelets from being destroyed. A replacement (transfusion) of platelets to stop or prevent bleeding. Surgery to remove the spleen. Follow these instructions at home: Medicines Take over-the-counter and prescription medicines only as told by your health care provider. Do not take any medicines that contain aspirin or NSAIDs, such as ibuprofen. These medicines increase your risk for dangerous bleeding. Activity Avoid activities that could cause injury or bruising, and follow instructions about how to prevent falls. Do not play contact sports. Ask your health care provider what activities are safe for you. Take extra care to protect yourself from burns when ironing or cooking. Take extra care not to cut yourself when you shave or when you use scissors, needles, knives, and other tools. General instructions  Check your skin and the inside of your mouth for bruising or bleeding as told by your health care provider. Wear a medical alert bracelet that says that you have a bleeding disorder. This can help you get the treatment you need in case of emergency. Check   your urine and stool for blood as told by your health  care provider. Do not drink alcohol. If you do drink alcohol, limit the amount that you drink. Minimize contact with toxic chemicals. Tell all your health care providers, including dental care providers and eye doctors, about your condition. Make sure to tell dental care providers before you have any procedure done, including dental cleanings. Keep all follow-up visits. This is important. Contact a health care provider if: You have unexplained bruising. You have new symptoms. You have symptoms that get worse. You have a fever. Get help right away if: You have severe bleeding from anywhere on your body. You have blood in your vomit, urine, or stool. You have an injury to your head. You have a sudden, severe headache. Summary Thrombocytopenia is a condition in which you have a low number of platelets in the blood. Platelets are parts of blood that stick together to form a clot. Symptoms of this condition are the result of poor blood clotting and may include bruising easily, bleeding from the nose or mouth, petechiae, and purpura. This condition may be diagnosed with blood tests and a physical exam. Treatment for this condition depends on the cause. This information is not intended to replace advice given to you by your health care provider. Make sure you discuss any questions you have with your health care provider. Document Revised: 08/31/2020 Document Reviewed: 08/31/2020 Elsevier Patient Education  2023 Elsevier Inc.  

## 2022-02-13 NOTE — Progress Notes (Signed)
Subjective:  Patient ID: Jake [name], male    DOB: 05-Mar-1956  Age: 66 y.o. MRN: [id number]  CC: Hypertension   HPI Jake [name] presents for f/up -  He is active and denies chest pain, shortness of breath, diaphoresis, or edema.  Outpatient Medications Prior to Visit  Medication Sig Dispense Refill   amLODipine (NORVASC) 5 MG tablet Take 1 tablet (5 mg total) by mouth daily. 90 tablet 0   olmesartan (BENICAR) 20 MG tablet Take 1 tablet (20 mg total) by mouth daily. 90 tablet 0   amLODipine-olmesartan (AZOR) 5-20 MG tablet Take 1 tablet by mouth daily. (Patient not taking: Reported on 02/13/2022)     No facility-administered medications prior to visit.    ROS Review of Systems  Constitutional:  Negative for diaphoresis and fatigue.  HENT: Negative.    Eyes: Negative.   Respiratory:  Negative for cough, chest tightness and shortness of breath.   Cardiovascular:  Negative for chest pain, palpitations and leg swelling.  Gastrointestinal:  Negative for abdominal pain, diarrhea, nausea and vomiting.  Endocrine: Negative.   Genitourinary: Negative.   Musculoskeletal: Negative.  Negative for arthralgias and myalgias.  Skin: Negative.   Allergic/Immunologic: Negative.   Neurological: Negative.  Negative for dizziness.  Hematological:  Negative for adenopathy. Does not bruise/bleed easily.  Psychiatric/Behavioral: Negative.      Objective:  BP 132/86 (BP Location: Left Arm, Patient Position: Sitting, Cuff Size: Large)   Pulse 82   Temp 98.1 F (36.7 C) (Oral)   Resp 16   Ht '6\' 1"'$  (1.854 m)   Wt 203 lb 6 oz (92.3 kg)   SpO2 97%   BMI 26.83 kg/m   BP Readings from Last 3 Encounters:  02/13/22 132/86  10/23/21 (!) 158/96  06/26/17 (!) 150/100    Wt Readings from Last 3 Encounters:  02/13/22 203 lb 6 oz (92.3 kg)  10/23/21 193 lb (87.5 kg)  06/26/17 216 lb 6.4 oz (98.2 kg)    Physical Exam Vitals reviewed.  HENT:     Nose: Nose normal.     Mouth/Throat:      Mouth: Mucous membranes are moist.  Eyes:     General: No scleral icterus.    Conjunctiva/sclera: Conjunctivae normal.  Cardiovascular:     Rate and Rhythm: Normal rate and regular rhythm.     Heart sounds: No murmur heard. Pulmonary:     Effort: Pulmonary effort is normal.     Breath sounds: No stridor. No wheezing, rhonchi or rales.  Abdominal:     General: Abdomen is flat.     Palpations: There is no mass.     Tenderness: There is no abdominal tenderness. There is no guarding.     Hernia: No hernia is present.  Musculoskeletal:        General: Normal range of motion.     Cervical back: Neck supple.     Right lower leg: No edema.     Left lower leg: No edema.  Lymphadenopathy:     Cervical: No cervical adenopathy.  Skin:    General: Skin is warm and dry.     Findings: No bruising.  Neurological:     General: No focal deficit present.     Mental Status: He is alert.  Psychiatric:        Mood and Affect: Mood normal.        Behavior: Behavior normal.     Lab Results  Component Value Date   WBC 4.0  02/13/2022   HGB 14.4 02/13/2022   HCT 42.6 02/13/2022   PLT 162.0 02/13/2022   GLUCOSE 111 (H) 02/13/2022   CHOL 179 10/23/2021   TRIG 183.0 (H) 10/23/2021   HDL 75.40 10/23/2021   LDLCALC 67 10/23/2021   ALT 16 10/23/2021   AST 13 10/23/2021   NA 143 02/13/2022   K 4.1 02/13/2022   CL 108 02/13/2022   CREATININE 1.04 02/13/2022   BUN 20 02/13/2022   CO2 28 02/13/2022   TSH 2.06 10/23/2021   PSA 1.07 10/23/2021    NM Sentinel Node Inj-No Rpt (Melanoma)  Result Date: 06/26/2017 Sulfur colloid was injected by the nuclear medicine technologist for melanoma sentinel node.    Assessment & Plan:   Nolen was seen today for hypertension.  Diagnoses and all orders for this visit:  Essential hypertension, benign- His blood pressure is adequately well controlled.  Electrolytes and renal function are normal. -     CBC with Differential/Platelet; Future -     Basic  metabolic panel; Future -     Basic metabolic panel -     CBC with Differential/Platelet  Thrombocytopenia ([place])- His platelets are normal.  B12 and folate are normal. -     CBC with Differential/Platelet; Future -     Vitamin B12; Future -     Folate; Future -     Vitamin B12 -     CBC with Differential/Platelet -     Folate  Flu vaccine need -     Flu Vaccine QUAD 72moIM (Fluarix, Fluzone & Alfiuria Quad PF)   I have discontinued Dannis M. Babineau's amLODipine-olmesartan. I am also having him maintain his olmesartan and amLODipine.  No orders of the defined types were placed in this encounter.    Follow-up: Return in about 6 months (around 08/14/2022).  TScarlette Calico MD

## 2022-02-14 ENCOUNTER — Ambulatory Visit: Payer: BLUE CROSS/BLUE SHIELD | Admitting: Internal Medicine

## 2022-02-15 DIAGNOSIS — H353122 Nonexudative age-related macular degeneration, left eye, intermediate dry stage: Secondary | ICD-10-CM | POA: Diagnosis not present

## 2022-02-15 DIAGNOSIS — H353211 Exudative age-related macular degeneration, right eye, with active choroidal neovascularization: Secondary | ICD-10-CM | POA: Diagnosis not present

## 2022-02-15 DIAGNOSIS — H43822 Vitreomacular adhesion, left eye: Secondary | ICD-10-CM | POA: Diagnosis not present

## 2022-02-15 DIAGNOSIS — H35373 Puckering of macula, bilateral: Secondary | ICD-10-CM | POA: Diagnosis not present

## 2022-05-02 ENCOUNTER — Telehealth: Payer: Self-pay | Admitting: Internal Medicine

## 2022-05-02 DIAGNOSIS — I10 Essential (primary) hypertension: Secondary | ICD-10-CM

## 2022-05-02 MED ORDER — OLMESARTAN MEDOXOMIL 20 MG PO TABS: 20.0000 mg | ORAL_TABLET | Freq: Every day | ORAL | 0 refills | Status: DC

## 2022-05-02 NOTE — Telephone Encounter (Signed)
olmesartan (BENICAR) 20 MG tablet  pt is requesting refill of above Rx  Preferred pharmacy [facility]

## 2022-05-03 ENCOUNTER — Other Ambulatory Visit: Payer: Self-pay | Admitting: Internal Medicine

## 2022-05-03 DIAGNOSIS — I10 Essential (primary) hypertension: Secondary | ICD-10-CM

## 2022-05-08 ENCOUNTER — Telehealth: Payer: Self-pay | Admitting: Internal Medicine

## 2022-05-08 DIAGNOSIS — I10 Essential (primary) hypertension: Secondary | ICD-10-CM

## 2022-05-08 MED ORDER — AMLODIPINE BESYLATE 5 MG PO TABS: 5.0000 mg | ORAL_TABLET | Freq: Every day | ORAL | 0 refills | Status: DC

## 2022-05-08 NOTE — Telephone Encounter (Signed)
Caller & Relationship to patient: Pharmacy  Call back number: [phone]   Date of last office visit: 11.15.23  Date of next office visit: 8.5.24  Medication(s) to be refilled:  amLODipine (NORVASC) 5 MG tablet    Preferred Pharmacy:  [facility]   Phone: [phone]  Fax: [phone]

## 2022-05-21 DIAGNOSIS — H353211 Exudative age-related macular degeneration, right eye, with active choroidal neovascularization: Secondary | ICD-10-CM | POA: Diagnosis not present

## 2022-08-14 DIAGNOSIS — D225 Melanocytic nevi of trunk: Secondary | ICD-10-CM | POA: Diagnosis not present

## 2022-08-14 DIAGNOSIS — Z08 Encounter for follow-up examination after completed treatment for malignant neoplasm: Secondary | ICD-10-CM | POA: Diagnosis not present

## 2022-08-14 DIAGNOSIS — L814 Other melanin hyperpigmentation: Secondary | ICD-10-CM | POA: Diagnosis not present

## 2022-08-14 DIAGNOSIS — L821 Other seborrheic keratosis: Secondary | ICD-10-CM | POA: Diagnosis not present

## 2022-08-14 DIAGNOSIS — Z85820 Personal history of malignant melanoma of skin: Secondary | ICD-10-CM | POA: Diagnosis not present

## 2022-08-14 DIAGNOSIS — L570 Actinic keratosis: Secondary | ICD-10-CM | POA: Diagnosis not present

## 2022-08-16 DIAGNOSIS — H35373 Puckering of macula, bilateral: Secondary | ICD-10-CM | POA: Diagnosis not present

## 2022-08-16 DIAGNOSIS — H43822 Vitreomacular adhesion, left eye: Secondary | ICD-10-CM | POA: Diagnosis not present

## 2022-08-16 DIAGNOSIS — H35033 Hypertensive retinopathy, bilateral: Secondary | ICD-10-CM | POA: Diagnosis not present

## 2022-08-16 DIAGNOSIS — H353122 Nonexudative age-related macular degeneration, left eye, intermediate dry stage: Secondary | ICD-10-CM | POA: Diagnosis not present

## 2022-08-16 DIAGNOSIS — H353211 Exudative age-related macular degeneration, right eye, with active choroidal neovascularization: Secondary | ICD-10-CM | POA: Diagnosis not present

## 2022-08-16 DIAGNOSIS — H2513 Age-related nuclear cataract, bilateral: Secondary | ICD-10-CM | POA: Diagnosis not present

## 2022-08-16 DIAGNOSIS — H43811 Vitreous degeneration, right eye: Secondary | ICD-10-CM | POA: Diagnosis not present

## 2022-10-29 DIAGNOSIS — Z0100 Encounter for examination of eyes and vision without abnormal findings: Secondary | ICD-10-CM | POA: Diagnosis not present

## 2022-11-04 ENCOUNTER — Encounter: Payer: Self-pay | Admitting: Internal Medicine

## 2022-11-04 ENCOUNTER — Ambulatory Visit: Payer: Medicare HMO | Admitting: Internal Medicine

## 2022-11-04 VITALS — BP 148/88 | HR 95 | Temp 98.8°F | Resp 16 | Ht 73.0 in | Wt 211.0 lb

## 2022-11-04 DIAGNOSIS — E785 Hyperlipidemia, unspecified: Secondary | ICD-10-CM

## 2022-11-04 DIAGNOSIS — I10 Essential (primary) hypertension: Secondary | ICD-10-CM | POA: Diagnosis not present

## 2022-11-04 DIAGNOSIS — Z23 Encounter for immunization: Secondary | ICD-10-CM

## 2022-11-04 DIAGNOSIS — D696 Thrombocytopenia, unspecified: Secondary | ICD-10-CM

## 2022-11-04 DIAGNOSIS — Z0000 Encounter for general adult medical examination without abnormal findings: Secondary | ICD-10-CM | POA: Diagnosis not present

## 2022-11-04 DIAGNOSIS — Z125 Encounter for screening for malignant neoplasm of prostate: Secondary | ICD-10-CM

## 2022-11-04 LAB — URINALYSIS, ROUTINE W REFLEX MICROSCOPIC
Bilirubin Urine: NEGATIVE
Hgb urine dipstick: NEGATIVE
Ketones, ur: NEGATIVE
Leukocytes,Ua: NEGATIVE
Nitrite: NEGATIVE
RBC / HPF: NONE SEEN (ref 0–?)
Specific Gravity, Urine: 1.02 (ref 1.000–1.030)
Total Protein, Urine: NEGATIVE
Urine Glucose: NEGATIVE
Urobilinogen, UA: 0.2 (ref 0.0–1.0)
WBC, UA: NONE SEEN (ref 0–?)
pH: 6 (ref 5.0–8.0)

## 2022-11-04 LAB — CBC WITH DIFFERENTIAL/PLATELET
Basophils Absolute: 0 10*3/uL (ref 0.0–0.1)
Basophils Relative: 0.5 % (ref 0.0–3.0)
Eosinophils Absolute: 0.1 10*3/uL (ref 0.0–0.7)
Eosinophils Relative: 2.6 % (ref 0.0–5.0)
HCT: 43.8 % (ref 39.0–52.0)
Hemoglobin: 14.8 g/dL (ref 13.0–17.0)
Lymphocytes Relative: 28.6 % (ref 12.0–46.0)
Lymphs Abs: 1.2 10*3/uL (ref 0.7–4.0)
MCHC: 33.7 g/dL (ref 30.0–36.0)
MCV: 95.2 fl (ref 78.0–100.0)
Monocytes Absolute: 0.4 10*3/uL (ref 0.1–1.0)
Monocytes Relative: 9.7 % (ref 3.0–12.0)
Neutro Abs: 2.4 10*3/uL (ref 1.4–7.7)
Neutrophils Relative %: 58.6 % (ref 43.0–77.0)
Platelets: 140 10*3/uL — ABNORMAL LOW (ref 150.0–400.0)
RBC: 4.6 Mil/uL (ref 4.22–5.81)
RDW: 13.2 % (ref 11.5–15.5)
WBC: 4.2 10*3/uL (ref 4.0–10.5)

## 2022-11-04 LAB — BASIC METABOLIC PANEL
BUN: 12 mg/dL (ref 6–23)
CO2: 26 mEq/L (ref 19–32)
Calcium: 8.8 mg/dL (ref 8.4–10.5)
Chloride: 105 mEq/L (ref 96–112)
Creatinine, Ser: 1.09 mg/dL (ref 0.40–1.50)
GFR: 71.22 mL/min (ref >60.00)
Glucose, Bld: 101 mg/dL — ABNORMAL HIGH (ref 70–99)
Potassium: 4.4 mEq/L (ref 3.5–5.1)
Sodium: 140 mEq/L (ref 135–145)

## 2022-11-04 LAB — HEPATIC FUNCTION PANEL
ALT: 14 U/L (ref 0–53)
AST: 12 U/L (ref 0–37)
Albumin: 4.4 g/dL (ref 3.5–5.2)
Alkaline Phosphatase: 85 U/L (ref 39–117)
Bilirubin, Direct: 0.1 mg/dL (ref 0.0–0.3)
Total Bilirubin: 0.4 mg/dL (ref 0.2–1.2)
Total Protein: 6.4 g/dL (ref 6.0–8.3)

## 2022-11-04 LAB — LIPID PANEL
Cholesterol: 170 mg/dL (ref 0–200)
HDL: 61.5 mg/dL (ref >39.00)
LDL Cholesterol: 92 mg/dL (ref 0–99)
NonHDL: 108.05
Total CHOL/HDL Ratio: 3
Triglycerides: 80 mg/dL (ref 0.0–149.0)
VLDL: 16 mg/dL (ref 0.0–40.0)

## 2022-11-04 LAB — PSA: PSA: 0.92 ng/mL (ref 0.10–4.00)

## 2022-11-04 MED ORDER — AMLODIPINE BESYLATE 5 MG PO TABS
5.0000 mg | ORAL_TABLET | Freq: Every day | ORAL | 1 refills | Status: DC
Start: 2022-11-04 — End: 2022-11-04

## 2022-11-04 MED ORDER — OLMESARTAN MEDOXOMIL 20 MG PO TABS
20.0000 mg | ORAL_TABLET | Freq: Every day | ORAL | 1 refills | Status: DC
Start: 2022-11-04 — End: 2023-02-05

## 2022-11-04 MED ORDER — SHINGRIX 50 MCG/0.5ML IM SUSR
0.5000 mL | Freq: Once | INTRAMUSCULAR | 1 refills | Status: AC
Start: 2022-11-04 — End: 2022-11-04

## 2022-11-04 MED ORDER — AMLODIPINE BESYLATE 5 MG PO TABS
5.0000 mg | ORAL_TABLET | Freq: Every day | ORAL | 1 refills | Status: DC
Start: 2022-11-04 — End: 2023-04-04

## 2022-11-04 MED ORDER — ROSUVASTATIN CALCIUM 10 MG PO TABS
10.0000 mg | ORAL_TABLET | Freq: Every day | ORAL | 1 refills | Status: DC
Start: 2022-11-04 — End: 2023-02-05

## 2022-11-04 NOTE — Patient Instructions (Signed)
Health Maintenance, Male Adopting a healthy lifestyle and getting preventive care are important in promoting health and wellness. Ask your health care provider about: The right schedule for you to have regular tests and exams. Things you can do on your own to prevent diseases and keep yourself healthy. What should I know about diet, weight, and exercise? Eat a healthy diet  Eat a diet that includes plenty of vegetables, fruits, low-fat dairy products, and lean protein. Do not eat a lot of foods that are high in solid fats, added sugars, or sodium. Maintain a healthy weight Body mass index (BMI) is a measurement that can be used to identify possible weight problems. It estimates body fat based on height and weight. Your health care provider can help determine your BMI and help you achieve or maintain a healthy weight. Get regular exercise Get regular exercise. This is one of the most important things you can do for your health. Most adults should: Exercise for at least 150 minutes each week. The exercise should increase your heart rate and make you sweat (moderate-intensity exercise). Do strengthening exercises at least twice a week. This is in addition to the moderate-intensity exercise. Spend less time sitting. Even light physical activity can be beneficial. Watch cholesterol and blood lipids Have your blood tested for lipids and cholesterol at 65 years of age, then have this test every 5 years. You may need to have your cholesterol levels checked more often if: Your lipid or cholesterol levels are high. You are older than 65 years of age. You are at high risk for heart disease. What should I know about cancer screening? Many types of cancers can be detected early and may often be prevented. Depending on your health history and family history, you may need to have cancer screening at various ages. This may include screening for: Colorectal cancer. Prostate cancer. Skin cancer. Lung  cancer. What should I know about heart disease, diabetes, and high blood pressure? Blood pressure and heart disease High blood pressure causes heart disease and increases the risk of stroke. This is more likely to develop in people who have high blood pressure readings or are overweight. Talk with your health care provider about your target blood pressure readings. Have your blood pressure checked: Every 3-5 years if you are 18-39 years of age. Every year if you are 40 years old or older. If you are between the ages of 65 and 75 and are a current or former smoker, ask your health care provider if you should have a one-time screening for abdominal aortic aneurysm (AAA). Diabetes Have regular diabetes screenings. This checks your fasting blood sugar level. Have the screening done: Once every three years after age 45 if you are at a normal weight and have a low risk for diabetes. More often and at a younger age if you are overweight or have a high risk for diabetes. What should I know about preventing infection? Hepatitis B If you have a higher risk for hepatitis B, you should be screened for this virus. Talk with your health care provider to find out if you are at risk for hepatitis B infection. Hepatitis C Blood testing is recommended for: Everyone born from 1945 through 1965. Anyone with known risk factors for hepatitis C. Sexually transmitted infections (STIs) You should be screened each year for STIs, including gonorrhea and chlamydia, if: You are sexually active and are younger than 65 years of age. You are older than 65 years of age and your   health care provider tells you that you are at risk for this type of infection. Your sexual activity has changed since you were last screened, and you are at increased risk for chlamydia or gonorrhea. Ask your health care provider if you are at risk. Ask your health care provider about whether you are at high risk for HIV. Your health care provider  may recommend a prescription medicine to help prevent HIV infection. If you choose to take medicine to prevent HIV, you should first get tested for HIV. You should then be tested every 3 months for as long as you are taking the medicine. Follow these instructions at home: Alcohol use Do not drink alcohol if your health care provider tells you not to drink. If you drink alcohol: Limit how much you have to 0-2 drinks a day. Know how much alcohol is in your drink. In the [place] one drink equals one 12 oz bottle of beer (355 mL), one 5 oz glass of wine (148 mL), or one 1 oz glass of hard liquor (44 mL). Lifestyle Do not use any products that contain nicotine or tobacco. These products include cigarettes, chewing tobacco, and vaping devices, such as e-cigarettes. If you need help quitting, ask your health care provider. Do not use street drugs. Do not share needles. Ask your health care provider for help if you need support or information about quitting drugs. General instructions Schedule regular health, dental, and eye exams. Stay current with your vaccines. Tell your health care provider if: You often feel depressed. You have ever been abused or do not feel safe at home. Summary Adopting a healthy lifestyle and getting preventive care are important in promoting health and wellness. Follow your health care provider's instructions about healthy diet, exercising, and getting tested or screened for diseases. Follow your health care provider's instructions on monitoring your cholesterol and blood pressure. This information is not intended to replace advice given to you by your health care provider. Make sure you discuss any questions you have with your health care provider. Document Revised: 08/07/2020 Document Reviewed: 08/07/2020 Elsevier Patient Education  2024 Elsevier Inc.  

## 2022-11-04 NOTE — Progress Notes (Signed)
Subjective:  Patient ID: Gwenette Greet, male    DOB: May 23, 1955  Age: 67 y.o. MRN: [id number]  CC: Annual Exam, Hypertension, and Hyperlipidemia   HPI Elisaul Hammermeister Sillas presents for a CPX and f/up ----  Discussed the use of AI scribe software for clinical note transcription with the patient, who gave verbal consent to proceed.  History of Present Illness   The patient, with a history of hypertension, reports no symptoms of high blood pressure such as headache, blurred vision, chest pain, shortness of breath, or edema. They have been adhering to their prescribed medication regimen, although they ran out of the pills recently. They have been engaging in regular physical activity, specifically swimming, which they report has been beneficial for their overall health and well-being.  The patient also reports no urinary symptoms or hematuria. Their last colonoscopy, conducted a year ago, was normal. They have been feeling well overall, with a marked improvement in their mood and general outlook compared to the previous year.       Outpatient Medications Prior to Visit  Medication Sig Dispense Refill   amLODipine (NORVASC) 5 MG tablet Take 1 tablet (5 mg total) by mouth daily. 90 tablet 0   olmesartan (BENICAR) 20 MG tablet Take 1 tablet (20 mg total) by mouth daily. 90 tablet 0   No facility-administered medications prior to visit.    ROS Review of Systems  Constitutional: Negative.  Negative for chills, diaphoresis, fatigue and fever.  HENT: Negative.    Eyes: Negative.   Respiratory: Negative.  Negative for cough, chest tightness, shortness of breath and wheezing.   Cardiovascular:  Negative for chest pain, palpitations and leg swelling.  Gastrointestinal:  Negative for abdominal pain, blood in stool, constipation, diarrhea, nausea and vomiting.  Genitourinary: Negative.  Negative for difficulty urinating.  Musculoskeletal: Negative.  Negative for arthralgias and myalgias.  Skin:  Negative.   Neurological: Negative.  Negative for dizziness, weakness, light-headedness and headaches.  Hematological:  Negative for adenopathy. Does not bruise/bleed easily.  Psychiatric/Behavioral: Negative.      Objective:  BP (!) 148/88 (BP Location: Left Arm, Patient Position: Sitting, Cuff Size: Large)   Pulse 95   Temp 98.8 F (37.1 C) (Oral)   Resp 16   Ht 6\' 1"  (1.854 m)   Wt 211 lb (95.7 kg)   SpO2 98%   BMI 27.84 kg/m   BP Readings from Last 3 Encounters:  11/04/22 (!) 148/88  02/13/22 132/86  10/23/21 (!) 158/96    Wt Readings from Last 3 Encounters:  11/04/22 211 lb (95.7 kg)  02/13/22 203 lb 6 oz (92.3 kg)  10/23/21 193 lb (87.5 kg)    Physical Exam Vitals reviewed.  HENT:     Mouth/Throat:     Mouth: Mucous membranes are moist.  Eyes:     General: No scleral icterus.    Conjunctiva/sclera: Conjunctivae normal.  Cardiovascular:     Rate and Rhythm: Normal rate and regular rhythm.     Heart sounds: Normal heart sounds, S1 normal and S2 normal. Heart sounds not distant. No murmur heard.    Comments: EKG- NSR, 84 bpm ?LAE No LVH, Q waves, or ST/T waves  Pulmonary:     Effort: Pulmonary effort is normal.     Breath sounds: No stridor. No wheezing, rhonchi or rales.  Abdominal:     General: Abdomen is flat.     Palpations: There is no mass.     Tenderness: There is no abdominal tenderness. There  is no guarding or rebound.     Hernia: No hernia is present.  Musculoskeletal:        General: Normal range of motion.     Cervical back: Neck supple.     Right lower leg: No edema.     Left lower leg: No edema.  Lymphadenopathy:     Cervical: No cervical adenopathy.  Skin:    General: Skin is dry.     Findings: No bruising.  Neurological:     General: No focal deficit present.     Mental Status: He is alert. Mental status is at baseline.  Psychiatric:        Mood and Affect: Mood normal.        Behavior: Behavior normal.     Lab Results   Component Value Date   WBC 4.2 11/04/2022   HGB 14.8 11/04/2022   HCT 43.8 11/04/2022   PLT 140.0 (L) 11/04/2022   GLUCOSE 101 (H) 11/04/2022   CHOL 170 11/04/2022   TRIG 80.0 11/04/2022   HDL 61.50 11/04/2022   LDLCALC 92 11/04/2022   ALT 14 11/04/2022   AST 12 11/04/2022   NA 140 11/04/2022   K 4.4 11/04/2022   CL 105 11/04/2022   CREATININE 1.09 11/04/2022   BUN 12 11/04/2022   CO2 26 11/04/2022   TSH 2.06 10/23/2021   PSA 0.92 11/04/2022    NM Sentinel Node Inj-No Rpt (Melanoma)  Result Date: 06/26/2017 Sulfur colloid was injected by the nuclear medicine technologist for melanoma sentinel node.    Assessment & Plan:   Essential hypertension, benign- He has not achieved his blood pressure goal.  Will restart his antihypertensives. -     Urinalysis, Routine w reflex microscopic; Future -     Hepatic function panel; Future -     Basic metabolic panel; Future -     CBC with Differential/Platelet; Future -     EKG 12-Lead -     amLODIPine Besylate; Take 1 tablet (5 mg total) by mouth daily.  Dispense: 90 tablet; Refill: 1 -     Olmesartan Medoxomil; Take 1 tablet (20 mg total) by mouth daily.  Dispense: 90 tablet; Refill: 1  Routine general medical examination at a health care facility- Exam completed, labs reviewed, vaccines reviewed and updated, cancer screenings addressed, pt ed material was given.  -     Shingrix; Inject 0.5 mLs into the muscle once for 1 dose.  Dispense: 0.5 mL; Refill: 1  Dyslipidemia, goal LDL below 100- Will start a statin for CV risk reduction and will evaluate for CAD with a CCS. -     Lipid panel; Future -     Lipoprotein A (LPA); Future -     Hepatic function panel; Future -     Rosuvastatin Calcium; Take 1 tablet (10 mg total) by mouth daily.  Dispense: 90 tablet; Refill: 1 -     CT CARDIAC SCORING (SELF PAY ONLY); Future  Need for vaccination -     Pneumococcal conjugate vaccine 20-valent  Prostate cancer screening -     PSA;  Future  Thrombocytopenia ([facility])- There is no history of bleeding or bruising.     Follow-up: Return in about 6 months (around 05/07/2023).  Sanda Linger, MD

## 2022-11-22 DIAGNOSIS — H2513 Age-related nuclear cataract, bilateral: Secondary | ICD-10-CM | POA: Diagnosis not present

## 2022-11-22 DIAGNOSIS — H35373 Puckering of macula, bilateral: Secondary | ICD-10-CM | POA: Diagnosis not present

## 2022-11-22 DIAGNOSIS — H43811 Vitreous degeneration, right eye: Secondary | ICD-10-CM | POA: Diagnosis not present

## 2022-11-22 DIAGNOSIS — H353211 Exudative age-related macular degeneration, right eye, with active choroidal neovascularization: Secondary | ICD-10-CM | POA: Diagnosis not present

## 2022-11-22 DIAGNOSIS — H35033 Hypertensive retinopathy, bilateral: Secondary | ICD-10-CM | POA: Diagnosis not present

## 2022-11-22 DIAGNOSIS — H43822 Vitreomacular adhesion, left eye: Secondary | ICD-10-CM | POA: Diagnosis not present

## 2022-11-22 DIAGNOSIS — H353122 Nonexudative age-related macular degeneration, left eye, intermediate dry stage: Secondary | ICD-10-CM | POA: Diagnosis not present

## 2022-11-26 ENCOUNTER — Other Ambulatory Visit: Payer: Medicare HMO

## 2022-12-31 ENCOUNTER — Ambulatory Visit
Admission: RE | Admit: 2022-12-31 | Discharge: 2022-12-31 | Disposition: A | Discharge status: Home / Self Care | Payer: Medicare HMO | Source: Ambulatory Visit | Attending: Internal Medicine | Admitting: Internal Medicine

## 2022-12-31 DIAGNOSIS — E785 Hyperlipidemia, unspecified: Secondary | ICD-10-CM

## 2022-12-31 DIAGNOSIS — I2510 Atherosclerotic heart disease of native coronary artery without angina pectoris: Secondary | ICD-10-CM | POA: Diagnosis not present

## 2023-01-08 ENCOUNTER — Other Ambulatory Visit: Payer: Self-pay | Admitting: Internal Medicine

## 2023-02-04 ENCOUNTER — Other Ambulatory Visit: Payer: Self-pay | Admitting: Internal Medicine

## 2023-02-04 DIAGNOSIS — E785 Hyperlipidemia, unspecified: Secondary | ICD-10-CM

## 2023-02-04 DIAGNOSIS — I10 Essential (primary) hypertension: Secondary | ICD-10-CM

## 2023-02-13 DIAGNOSIS — D225 Melanocytic nevi of trunk: Secondary | ICD-10-CM | POA: Diagnosis not present

## 2023-02-13 DIAGNOSIS — L814 Other melanin hyperpigmentation: Secondary | ICD-10-CM | POA: Diagnosis not present

## 2023-02-13 DIAGNOSIS — L821 Other seborrheic keratosis: Secondary | ICD-10-CM | POA: Diagnosis not present

## 2023-02-13 DIAGNOSIS — Z85820 Personal history of malignant melanoma of skin: Secondary | ICD-10-CM | POA: Diagnosis not present

## 2023-02-13 DIAGNOSIS — Z08 Encounter for follow-up examination after completed treatment for malignant neoplasm: Secondary | ICD-10-CM | POA: Diagnosis not present

## 2023-03-20 DIAGNOSIS — H43822 Vitreomacular adhesion, left eye: Secondary | ICD-10-CM | POA: Diagnosis not present

## 2023-03-20 DIAGNOSIS — H43811 Vitreous degeneration, right eye: Secondary | ICD-10-CM | POA: Diagnosis not present

## 2023-03-20 DIAGNOSIS — H35033 Hypertensive retinopathy, bilateral: Secondary | ICD-10-CM | POA: Diagnosis not present

## 2023-03-20 DIAGNOSIS — H2513 Age-related nuclear cataract, bilateral: Secondary | ICD-10-CM | POA: Diagnosis not present

## 2023-03-20 DIAGNOSIS — H353211 Exudative age-related macular degeneration, right eye, with active choroidal neovascularization: Secondary | ICD-10-CM | POA: Diagnosis not present

## 2023-03-20 DIAGNOSIS — H353122 Nonexudative age-related macular degeneration, left eye, intermediate dry stage: Secondary | ICD-10-CM | POA: Diagnosis not present

## 2023-03-20 DIAGNOSIS — H35373 Puckering of macula, bilateral: Secondary | ICD-10-CM | POA: Diagnosis not present

## 2023-04-03 ENCOUNTER — Other Ambulatory Visit: Payer: Self-pay | Admitting: Internal Medicine

## 2023-04-03 DIAGNOSIS — I10 Essential (primary) hypertension: Secondary | ICD-10-CM

## 2023-04-24 ENCOUNTER — Ambulatory Visit: Payer: Medicare HMO

## 2023-04-24 VITALS — BP 132/88 | HR 88 | Ht 71.5 in | Wt 215.8 lb

## 2023-04-24 DIAGNOSIS — Z0000 Encounter for general adult medical examination without abnormal findings: Secondary | ICD-10-CM | POA: Diagnosis not present

## 2023-04-24 NOTE — Patient Instructions (Signed)
Mr. Asselta , Thank you for taking time to come for your Medicare Wellness Visit. I appreciate your ongoing commitment to your health goals. Please review the following plan we discussed and let me know if I can assist you in the future.   Referrals/Orders/Follow-Ups/Clinician Recommendations: It was nice to meet you today.  Remember to get [facility] to update your records for the 2nd Shingles vaccine and your last Covid vaccine.  Aim for 30 minutes of exercise or brisk walking, 6-8 glasses of water, and 5 servings of fruits and vegetables each day.   This is a list of the screening recommended for you and due dates:  Health Maintenance  Topic Date Due   COVID-19 Vaccine (1) Never done   Zoster (Shingles) Vaccine (1 of 2) Never done   DTaP/Tdap/Td vaccine (3 - Td or Tdap) 02/09/2024   Medicare Annual Wellness Visit  04/23/2024   Cologuard (Stool DNA test)  11/08/2024   Pneumonia Vaccine  Completed   Flu Shot  Completed   Hepatitis C Screening  Completed   HIV Screening  Completed   HPV Vaccine  Aged Out    Advanced directives: (Declined) Advance directive discussed with you today. Even though you declined this today, please call our office should you change your mind, and we can give you the proper paperwork for you to fill out.  Next Medicare Annual Wellness Visit scheduled for next year: Yes

## 2023-04-24 NOTE — Progress Notes (Signed)
Subjective:   Jake [name] is a 68 y.o. male who presents for an Initial Medicare Annual Wellness Visit.  Visit Complete: In person   Cardiac Risk Factors include: male gender;advanced age (>77men, >38 women);hypertension;dyslipidemia     Objective:    Today's Vitals   04/24/23 0849  BP: 132/88  Pulse: 88  SpO2: 97%  Weight: 215 lb 12.8 oz (97.9 kg)  Height: 5' 11.5" (1.816 m)   Body mass index is 29.68 kg/m.     04/24/2023    9:04 AM 06/26/2017   11:44 AM 06/20/2017    3:27 PM  Advanced Directives  Does Patient Have a Medical Advance Directive? No No No  Would patient like information on creating a medical advance directive? Yes (MAU/Ambulatory/Procedural Areas - Information given) No - Patient declined     Current Medications (verified) Outpatient Encounter Medications as of 04/24/2023  Medication Sig   amLODipine (NORVASC) 5 MG tablet TAKE 1 TABLET EVERY DAY   olmesartan (BENICAR) 20 MG tablet TAKE 1 TABLET EVERY DAY   rosuvastatin (CRESTOR) 10 MG tablet TAKE 1 TABLET EVERY DAY   No facility-administered encounter medications on file as of 04/24/2023.    Allergies (verified) Patient has no known allergies.   History: Past Medical History:  Diagnosis Date   Macular degeneration    Past Surgical History:  Procedure Laterality Date   HERNIA REPAIR     MELANOMA EXCISION WITH SENTINEL LYMPH NODE BIOPSY Left 06/26/2017   Procedure: WIDE EXCISION MELANOMA LEFT BACK AND LEFT AXILLARY, SENTINEL NODE MAPPING;  Surgeon: Griselda Miner, MD;  Location: [facility];  Service: General;  Laterality: Left;   Family History  Problem Relation Age of Onset   Heart disease Mother    Hypertension Brother    Early death Neg Hx    Cancer Neg Hx    Alcohol abuse Neg Hx    Hyperlipidemia Neg Hx    Stroke Neg Hx    Kidney disease Neg Hx    COPD Neg Hx    Depression Neg Hx    Diabetes Neg Hx    Drug abuse Neg Hx    Social History   Socioeconomic History    Marital status: Single    Spouse name: Not on file   Number of children: Not on file   Years of education: Not on file   Highest education level: Not on file  Occupational History   Occupation: Part time-Undercurrent/SEMI RETIRED  Tobacco Use   Smoking status: Former    Current packs/day: 0.25    Average packs/day: 0.3 packs/day for 25.0 years (6.3 ttl pk-yrs)    Types: Cigarettes    Passive exposure: Past   Smokeless tobacco: Never  Vaping Use   Vaping status: Never Used  Substance and Sexual Activity   Alcohol use: Not Currently    Alcohol/week: 5.0 standard drinks of alcohol    Types: 5 Shots of liquor per week   Drug use: Yes    Types: Marijuana   Sexual activity: Yes    Partners: Male  Other Topics Concern   Not on file  Social History Narrative   Has a roommate   Social Drivers of Corporate investment banker Strain: Low Risk  (04/24/2023)   Overall Financial Resource Strain (CARDIA)    Difficulty of Paying Living Expenses: Not hard at all  Food Insecurity: No Food Insecurity (04/24/2023)   Hunger Vital Sign    Worried About Running Out of Food in  the Last Year: Never true    Ran Out of Food in the Last Year: Never true  Transportation Needs: No Transportation Needs (04/24/2023)   PRAPARE - Administrator, Civil Service (Medical): No    Lack of Transportation (Non-Medical): No  Physical Activity: Insufficiently Active (04/24/2023)   Exercise Vital Sign    Days of Exercise per Week: 3 days    Minutes of Exercise per Session: 40 min  Stress: No Stress Concern Present (04/24/2023)   [employer] of Occupational Health - Occupational Stress Questionnaire    Feeling of Stress : Not at all  Social Connections: Socially Isolated (04/24/2023)   Social Connection and Isolation Panel [NHANES]    Frequency of Communication with Friends and Family: Three times a week    Frequency of Social Gatherings with Friends and Family: Twice a week    Attends Religious  Services: Never    Database administrator or Organizations: No    Attends Engineer, structural: Never    Marital Status: Never married    Tobacco Counseling Counseling given: Not Answered   Clinical Intake:  Pre-visit preparation completed: Yes  Pain : No/denies pain     BMI - recorded: 29.68 Nutritional Risks: None Diabetes: No  How often do you need to have someone help you when you read instructions, pamphlets, or other written materials from your doctor or pharmacy?: 1 - Never  Interpreter Needed?: No  Information entered by :: Tasmine Hipwell, RMA   Activities of Daily Living    04/24/2023    7:57 AM  In your present state of health, do you have any difficulty performing the following activities:  Hearing? 0  Vision? 1  Difficulty concentrating or making decisions? 0  Walking or climbing stairs? 0  Dressing or bathing? 0  Doing errands, shopping? 0  Preparing Food and eating ? N  Using the Toilet? N  In the past six months, have you accidently leaked urine? N  Do you have problems with loss of bowel control? N  Managing your Medications? N  Managing your Finances? N  Housekeeping or managing your Housekeeping? N    Patient Care Team: Etta Grandchild, MD as PCP - General (Internal Medicine) Stephannie Li, MD as Consulting Physician (Ophthalmology)  Indicate any recent Medical Services you may have received from other than [facility] providers in the past year (date may be approximate).     Assessment:   This is a routine wellness examination for [name].  Hearing/Vision screen Hearing Screening - Comments:: Denies hearing difficulties   Vision Screening - Comments:: Wears eyeglasses   Goals Addressed               This Visit's Progress     Patient Stated (pt-stated)        Maintain his health      Depression Screen    04/24/2023    9:06 AM 02/13/2022    8:24 AM 10/23/2021    4:14 PM 02/08/2014    9:25 AM 12/18/2012    2:44 PM  PHQ  2/9 Scores  PHQ - 2 Score 0 0 0 0 0  PHQ- 9 Score 0 0       Fall Risk    04/24/2023    9:04 AM 02/13/2022    8:24 AM 10/23/2021    4:13 PM 02/08/2014    9:25 AM  Fall Risk   Falls in the past year? 0 0 0 No  Number falls in  past yr: 0 0    Injury with Fall? 0 0 0   Risk for fall due to : No Fall Risks No Fall Risks No Fall Risks   Follow up Falls prevention discussed;Falls evaluation completed Falls evaluation completed Falls evaluation completed     MEDICARE RISK AT HOME: Medicare Risk at Home Any stairs in or around the home?: No Home free of loose throw rugs in walkways, pet beds, electrical cords, etc?: Yes Adequate lighting in your home to reduce risk of falls?: Yes Life alert?: No Use of a cane, walker or w/c?: No Grab bars in the bathroom?: Yes Shower chair or bench in shower?: Yes Elevated toilet seat or a handicapped toilet?: Yes  TIMED UP AND GO:  Was the test performed? Yes  Length of time to ambulate 10 feet: 10 sec Gait steady and fast without use of assistive device    Cognitive Function:        04/24/2023    7:57 AM  6CIT Screen  What Year? 0 points  What month? 0 points  What time? 0 points  Count back from 20 0 points  Months in reverse 0 points  Repeat phrase 0 points  Total Score 0 points    Immunizations Immunization History  Administered Date(s) Administered   Influenza,inj,Quad PF,6+ Mos 02/13/2022   Influenza-Unspecified 12/04/2022   Moderna Covid-19 Fall Seasonal Vaccine 18yrs & older 09/01/2020   Moderna Covid-19 Vaccine Bivalent Booster 59yrs & up 04/02/2021, 02/13/2022   PNEUMOCOCCAL CONJUGATE-20 11/04/2022   Td 11/15/2002   Tdap 02/08/2014   Zoster Recombinant(Shingrix) 12/04/2022    TDAP status: Up to date  Flu Vaccine status: Up to date  Pneumococcal vaccine status: Up to date  Covid-19 vaccine status: Completed vaccines  Qualifies for Shingles Vaccine? Yes   Zostavax completed Yes   Shingrix Completed?: No.     Education has been provided regarding the importance of this vaccine. Patient has been advised to call insurance company to determine out of pocket expense if they have not yet received this vaccine. Advised may also receive vaccine at local pharmacy or Health Dept. Verbalized acceptance and understanding.  Screening Tests Health Maintenance  Topic Date Due   COVID-19 Vaccine (4 - 2024-25 season) 12/01/2022   Zoster Vaccines- Shingrix (2 of 2) 01/29/2023   DTaP/Tdap/Td (3 - Td or Tdap) 02/09/2024   Medicare Annual Wellness (AWV)  04/23/2024   Fecal DNA (Cologuard)  11/08/2024   Pneumonia Vaccine 35+ Years old  Completed   INFLUENZA VACCINE  Completed   Hepatitis C Screening  Completed   HIV Screening  Completed   HPV VACCINES  Aged Out    Health Maintenance  Health Maintenance Due  Topic Date Due   COVID-19 Vaccine (4 - 2024-25 season) 12/01/2022   Zoster Vaccines- Shingrix (2 of 2) 01/29/2023    Colorectal cancer screening: Type of screening: Cologuard. Completed 11/18/2021. Repeat every 3 years  Lung Cancer Screening: (Low Dose CT Chest recommended if Age 97-80 years, 20 pack-year currently smoking OR have quit w/in 15years.) does not qualify.   Lung Cancer Screening Referral: N/A  Additional Screening:  Hepatitis C Screening: does qualify; Completed 12/18/2012  Vision Screening: Recommended annual ophthalmology exams for early detection of glaucoma and other disorders of the eye. Is the patient up to date with their annual eye exam?  Yes  Who is the provider or what is the name of the office in which the patient attends annual eye exams? Dr. Allyne Gee  If pt  is not established with a provider, would they like to be referred to a provider to establish care? No .   Dental Screening: Recommended annual dental exams for proper oral hygiene   Community Resource Referral / Chronic Care Management: CRR required this visit?  No   CCM required this visit?  No    Plan:      I have personally reviewed and noted the following in the patient's chart:   Medical and social history Use of alcohol, tobacco or illicit drugs  Current medications and supplements including opioid prescriptions. Patient is not currently taking opioid prescriptions. Functional ability and status Nutritional status Physical activity Advanced directives List of other physicians Hospitalizations, surgeries, and ER visits in previous 12 months Vitals Screenings to include cognitive, depression, and falls Referrals and appointments  In addition, I have reviewed and discussed with patient certain preventive protocols, quality metrics, and best practice recommendations. A written personalized care plan for preventive services as well as general preventive health recommendations were provided to patient.     Autie Vasudevan L Trinity Hyland, CMA   04/24/2023   After Visit Summary: (MyChart) Due to this being a telephonic visit, the after visit summary with patients personalized plan was offered to patient via MyChart   Nurse Notes: Patient is up to date on all vaccines, however Covid vaccine and the 2nd Shingrix vaccine are not documented in [place] as of yet.  Patient also stated that he would like Dr. Yetta Barre to know that he has been having dull lower lt side pain, intermitted x 4 months.  He stated that he has not been drinking for this whole month and stated that it is not as sore as it has been but still wanted Dr. Yetta Barre to know about it.  Patient also wanted to know if he could get checked for vitamin D during his upcoming visit.  He had no other concerns to address today.

## 2023-05-08 ENCOUNTER — Telehealth: Payer: Self-pay

## 2023-05-08 ENCOUNTER — Encounter: Payer: Self-pay | Admitting: Internal Medicine

## 2023-05-08 ENCOUNTER — Ambulatory Visit: Payer: Medicare HMO | Admitting: Internal Medicine

## 2023-05-08 VITALS — BP 134/82 | HR 81 | Temp 98.4°F | Resp 16 | Ht 71.5 in | Wt 214.6 lb

## 2023-05-08 DIAGNOSIS — I10 Essential (primary) hypertension: Secondary | ICD-10-CM | POA: Diagnosis not present

## 2023-05-08 DIAGNOSIS — D696 Thrombocytopenia, unspecified: Secondary | ICD-10-CM | POA: Diagnosis not present

## 2023-05-08 LAB — URINALYSIS, ROUTINE W REFLEX MICROSCOPIC
Bilirubin Urine: NEGATIVE
Hgb urine dipstick: NEGATIVE
Ketones, ur: NEGATIVE
Leukocytes,Ua: NEGATIVE
Nitrite: NEGATIVE
RBC / HPF: NONE SEEN (ref 0–?)
Specific Gravity, Urine: >=1.03 — AB (ref 1.000–1.030)
Total Protein, Urine: NEGATIVE
Urine Glucose: NEGATIVE
Urobilinogen, UA: 0.2 (ref 0.0–1.0)
WBC, UA: NONE SEEN (ref 0–?)
pH: 6 (ref 5.0–8.0)

## 2023-05-08 LAB — CBC WITH DIFFERENTIAL/PLATELET
Basophils Absolute: 0 10*3/uL (ref 0.0–0.1)
Basophils Relative: 0.5 % (ref 0.0–3.0)
Eosinophils Absolute: 0.2 10*3/uL (ref 0.0–0.7)
Eosinophils Relative: 4 % (ref 0.0–5.0)
HCT: 43 % (ref 39.0–52.0)
Hemoglobin: 14.4 g/dL (ref 13.0–17.0)
Lymphocytes Relative: 23.9 % (ref 12.0–46.0)
Lymphs Abs: 1.4 10*3/uL (ref 0.7–4.0)
MCHC: 33.5 g/dL (ref 30.0–36.0)
MCV: 93.8 fL (ref 78.0–100.0)
Monocytes Absolute: 0.5 10*3/uL (ref 0.1–1.0)
Monocytes Relative: 7.8 % (ref 3.0–12.0)
Neutro Abs: 3.7 10*3/uL (ref 1.4–7.7)
Neutrophils Relative %: 63.8 % (ref 43.0–77.0)
Platelets: 157 10*3/uL (ref 150.0–400.0)
RBC: 4.58 Mil/uL (ref 4.22–5.81)
RDW: 12.3 % (ref 11.5–15.5)
WBC: 5.9 10*3/uL (ref 4.0–10.5)

## 2023-05-08 LAB — BASIC METABOLIC PANEL
BUN: 15 mg/dL (ref 6–23)
CO2: 25 meq/L (ref 19–32)
Calcium: 8.9 mg/dL (ref 8.4–10.5)
Chloride: 107 meq/L (ref 96–112)
Creatinine, Ser: 1.1 mg/dL (ref 0.40–1.50)
GFR: 70.19 mL/min (ref >60.00)
Glucose, Bld: 107 mg/dL — ABNORMAL HIGH (ref 70–99)
Potassium: 4.2 meq/L (ref 3.5–5.1)
Sodium: 143 meq/L (ref 135–145)

## 2023-05-08 LAB — VITAMIN B12: Vitamin B-12: 244 pg/mL (ref 211–911)

## 2023-05-08 LAB — FOLATE: Folate: 9.4 ng/mL (ref >5.9)

## 2023-05-08 NOTE — Progress Notes (Signed)
 Subjective:  Patient ID: Jake [name], male    DOB: 1955-09-01  Age: 68 y.o. MRN: [id number]  CC: Hypertension and Hyperlipidemia   HPI Jake [name] presents for f/up ----  Discussed the use of AI scribe software for clinical note transcription with the patient, who gave verbal consent to proceed.  History of Present Illness   Jake [name] is a 68 year old male who presents for a routine follow-up.  Jake [name] stays active with swimming a couple of days a week and plans to start walking with a friend. Jake [name] has stopped drinking alcohol, which has led to improvements in his skin and overall well-being. Jake [name] drinks 64 to 72 ounces of water daily and feels well-hydrated. No chest pain, shortness of breath, headache, blurred vision, numbness, weakness, tingling, night sweats, swollen lymph nodes, or urinary issues.  Jake [name] describes a dull sensation in the left flank area that Jake [name] noticed after returning from [facility] in September. The sensation has subsided since Jake [name] stopped drinking and is currently non-existent unless palpated. No associated symptoms such as dysuria or gross hematuria.  Jake [name] has a history of low platelets and is careful to avoid bruising. Jake [name] reports no bleeding or bruising and is cautious to avoid injury.  Jake [name] inquires about vitamin D supplementation due to feeling 'a little weird in the winter'.       Outpatient Medications Prior to Visit  Medication Sig Dispense Refill   amLODipine  (NORVASC ) 5 MG tablet TAKE 1 TABLET EVERY DAY 90 tablet 3   olmesartan  (BENICAR ) 20 MG tablet TAKE 1 TABLET EVERY DAY 90 tablet 0   rosuvastatin  (CRESTOR ) 10 MG tablet TAKE 1 TABLET EVERY DAY 90 tablet 0   No facility-administered medications prior to visit.    ROS Review of Systems  Constitutional: Negative.  Negative for chills, diaphoresis, fatigue and fever.  HENT: Negative.    Eyes: Negative.   Respiratory:  Negative for cough, chest tightness, shortness of breath and wheezing.    Cardiovascular:  Negative for chest pain, palpitations and leg swelling.  Gastrointestinal:  Negative for abdominal pain, constipation, diarrhea, nausea and vomiting.  Endocrine: Negative.   Genitourinary: Negative.  Negative for difficulty urinating and flank pain.  Musculoskeletal: Negative.   Skin: Negative.   Neurological: Negative.  Negative for dizziness and weakness.  Hematological:  Negative for adenopathy. Does not bruise/bleed easily.  Psychiatric/Behavioral: Negative.      Objective:  BP 134/82 (BP Location: Left Arm, Patient Position: Sitting, Cuff Size: Normal)   Pulse 81   Temp 98.4 F (36.9 C) (Oral)   Resp 16   Ht 5' 11.5 (1.816 m)   Wt 214 lb 9.6 oz (97.3 kg)   SpO2 96%   BMI 29.51 kg/m   BP Readings from Last 3 Encounters:  05/08/23 134/82  04/24/23 132/88  11/04/22 (!) 148/88    Wt Readings from Last 3 Encounters:  05/08/23 214 lb 9.6 oz (97.3 kg)  04/24/23 215 lb 12.8 oz (97.9 kg)  11/04/22 211 lb (95.7 kg)    Physical Exam Vitals reviewed.  Constitutional:      Appearance: Normal appearance.  HENT:     Mouth/Throat:     Mouth: Mucous membranes are moist.  Eyes:     General: No scleral icterus.    Conjunctiva/sclera: Conjunctivae normal.  Cardiovascular:     Rate and Rhythm: Normal rate and regular rhythm.     Heart sounds: No murmur heard. Pulmonary:     Effort: Pulmonary effort  is normal.     Breath sounds: No stridor. No wheezing, rhonchi or rales.  Abdominal:     General: Abdomen is flat.     Palpations: There is no mass.     Tenderness: There is no abdominal tenderness. There is no guarding.     Hernia: No hernia is present.  Musculoskeletal:        General: Normal range of motion.     Cervical back: Neck supple.     Right lower leg: No edema.     Left lower leg: No edema.  Lymphadenopathy:     Cervical: No cervical adenopathy.  Skin:    General: Skin is warm and dry.  Neurological:     General: No focal deficit present.      Mental Status: Jake [name] is alert. Mental status is at baseline.  Psychiatric:        Mood and Affect: Mood normal.        Behavior: Behavior normal.     Lab Results  Component Value Date   WBC 5.9 05/08/2023   HGB 14.4 05/08/2023   HCT 43.0 05/08/2023   PLT 157.0 05/08/2023   GLUCOSE 107 (H) 05/08/2023   CHOL 170 11/04/2022   TRIG 80.0 11/04/2022   HDL 61.50 11/04/2022   LDLCALC 92 11/04/2022   ALT 14 11/04/2022   AST 12 11/04/2022   NA 143 05/08/2023   K 4.2 05/08/2023   CL 107 05/08/2023   CREATININE 1.10 05/08/2023   BUN 15 05/08/2023   CO2 25 05/08/2023   TSH 2.06 10/23/2021   PSA 0.92 11/04/2022    CT CARDIAC SCORING ([facility] LOCATIONS ONLY) Result Date: 01/08/2023 CLINICAL DATA:  68 year old Caucasian male with history of dyslipidemia and hypertension. * Tracking Code: FCC * EXAM: CT CARDIAC CORONARY ARTERY CALCIUM  SCORE TECHNIQUE: Non-contrast imaging through the heart was performed using prospective ECG gating. Image post processing was performed on an independent workstation, allowing for quantitative analysis of the heart and coronary arteries. Note that this exam targets the heart and the chest was not imaged in its entirety. COMPARISON:  No priors. FINDINGS: CORONARY CALCIUM  SCORES: Left Main: 0 LAD: 12 LCx: 0 RCA/PDA: 74 Total Agatston Score: 86 MESA database percentile: 52nd AORTA MEASUREMENTS: Ascending Aorta: 4.4 cm Descending Aorta:2.6 cm OTHER FINDINGS: Within the visualized portions of the thorax there are no suspicious appearing pulmonary nodules or masses, there is no acute consolidative airspace disease, no pleural effusions, no pneumothorax and no lymphadenopathy. Visualized portions of the upper abdomen are unremarkable. There are no aggressive appearing lytic or blastic lesions noted in the visualized portions of the skeleton. IMPRESSION: 1. Patient's total coronary artery calcium  score is 86 which is 52nd percentile for patient's of matched age, gender and  race/ethnicity. Please note that although the presence of coronary artery calcium  documents the presence of coronary artery disease, the severity of this disease and any potential stenosis cannot be assessed on this noncontrast CT examination. Assessment for potential risk factor modification, dietary therapy or pharmacologic therapy may be warranted, if clinically indicated. 2. No significant incidental noncardiac findings are noted. Electronically Signed   By: Toribio Aye M.D.   On: 01/08/2023 14:48    Assessment & Plan:    Essential hypertension, benign- His BP is well controlled. -     Basic metabolic panel; Future -     CBC with Differential/Platelet; Future -     Urinalysis, Routine w reflex microscopic; Future  Thrombocytopenia (HCC)- PLTs are normal now. -  CBC with Differential/Platelet; Future -     Vitamin B12; Future -     Folate; Future     Follow-up: Return in about 6 months (around 11/05/2023).  Debby Molt, MD

## 2023-05-08 NOTE — Patient Instructions (Signed)
 Hypertension, Adult High blood pressure (hypertension) is when the force of blood pumping through the arteries is too strong. The arteries are the blood vessels that carry blood from the heart throughout the body. Hypertension forces the heart to work harder to pump blood and may cause arteries to become narrow or stiff. Untreated or uncontrolled hypertension can lead to a heart attack, heart failure, a stroke, kidney disease, and other problems. A blood pressure reading consists of a higher number over a lower number. Ideally, your blood pressure should be below 120/80. The first ("top") number is called the systolic pressure. It is a measure of the pressure in your arteries as your heart beats. The second ("bottom") number is called the diastolic pressure. It is a measure of the pressure in your arteries as the heart relaxes. What are the causes? The exact cause of this condition is not known. There are some conditions that result in high blood pressure. What increases the risk? Certain factors may make you more likely to develop high blood pressure. Some of these risk factors are under your control, including: Smoking. Not getting enough exercise or physical activity. Being overweight. Having too much fat, sugar, calories, or salt (sodium) in your diet. Drinking too much alcohol. Other risk factors include: Having a personal history of heart disease, diabetes, high cholesterol, or kidney disease. Stress. Having a family history of high blood pressure and high cholesterol. Having obstructive sleep apnea. Age. The risk increases with age. What are the signs or symptoms? High blood pressure may not cause symptoms. Very high blood pressure (hypertensive crisis) may cause: Headache. Fast or irregular heartbeats (palpitations). Shortness of breath. Nosebleed. Nausea and vomiting. Vision changes. Severe chest pain, dizziness, and seizures. How is this diagnosed? This condition is diagnosed by  measuring your blood pressure while you are seated, with your arm resting on a flat surface, your legs uncrossed, and your feet flat on the floor. The cuff of the blood pressure monitor will be placed directly against the skin of your upper arm at the level of your heart. Blood pressure should be measured at least twice using the same arm. Certain conditions can cause a difference in blood pressure between your right and left arms. If you have a high blood pressure reading during one visit or you have normal blood pressure with other risk factors, you may be asked to: Return on a different day to have your blood pressure checked again. Monitor your blood pressure at home for 1 week or longer. If you are diagnosed with hypertension, you may have other blood or imaging tests to help your health care provider understand your overall risk for other conditions. How is this treated? This condition is treated by making healthy lifestyle changes, such as eating healthy foods, exercising more, and reducing your alcohol intake. You may be referred for counseling on a healthy diet and physical activity. Your health care provider may prescribe medicine if lifestyle changes are not enough to get your blood pressure under control and if: Your systolic blood pressure is above 130. Your diastolic blood pressure is above 80. Your personal target blood pressure may vary depending on your medical conditions, your age, and other factors. Follow these instructions at home: Eating and drinking  Eat a diet that is high in fiber and potassium, and low in sodium, added sugar, and fat. An example of this eating plan is called the DASH diet. DASH stands for Dietary Approaches to Stop Hypertension. To eat this way: Eat  plenty of fresh fruits and vegetables. Try to fill one half of your plate at each meal with fruits and vegetables. Eat whole grains, such as whole-wheat pasta, brown rice, or whole-grain bread. Fill about one  fourth of your plate with whole grains. Eat or drink low-fat dairy products, such as skim milk or low-fat yogurt. Avoid fatty cuts of meat, processed or cured meats, and poultry with skin. Fill about one fourth of your plate with lean proteins, such as fish, chicken without skin, beans, eggs, or tofu. Avoid pre-made and processed foods. These tend to be higher in sodium, added sugar, and fat. Reduce your daily sodium intake. Many people with hypertension should eat less than 1,500 mg of sodium a day. Do not drink alcohol if: Your health care provider tells you not to drink. You are pregnant, may be pregnant, or are planning to become pregnant. If you drink alcohol: Limit how much you have to: 0-1 drink a day for women. 0-2 drinks a day for men. Know how much alcohol is in your drink. In the [place] one drink equals one 12 oz bottle of beer (355 mL), one 5 oz glass of wine (148 mL), or one 1 oz glass of hard liquor (44 mL). Lifestyle  Work with your health care provider to maintain a healthy body weight or to lose weight. Ask what an ideal weight is for you. Get at least 30 minutes of exercise that causes your heart to beat faster (aerobic exercise) most days of the week. Activities may include walking, swimming, or biking. Include exercise to strengthen your muscles (resistance exercise), such as Pilates or lifting weights, as part of your weekly exercise routine. Try to do these types of exercises for 30 minutes at least 3 days a week. Do not use any products that contain nicotine or tobacco. These products include cigarettes, chewing tobacco, and vaping devices, such as e-cigarettes. If you need help quitting, ask your health care provider. Monitor your blood pressure at home as told by your health care provider. Keep all follow-up visits. This is important. Medicines Take over-the-counter and prescription medicines only as told by your health care provider. Follow directions carefully. Blood  pressure medicines must be taken as prescribed. Do not skip doses of blood pressure medicine. Doing this puts you at risk for problems and can make the medicine less effective. Ask your health care provider about side effects or reactions to medicines that you should watch for. Contact a health care provider if you: Think you are having a reaction to a medicine you are taking. Have headaches that keep coming back (recurring). Feel dizzy. Have swelling in your ankles. Have trouble with your vision. Get help right away if you: Develop a severe headache or confusion. Have unusual weakness or numbness. Feel faint. Have severe pain in your chest or abdomen. Vomit repeatedly. Have trouble breathing. These symptoms may be an emergency. Get help right away. Call 911. Do not wait to see if the symptoms will go away. Do not drive yourself to the hospital. Summary Hypertension is when the force of blood pumping through your arteries is too strong. If this condition is not controlled, it may put you at risk for serious complications. Your personal target blood pressure may vary depending on your medical conditions, your age, and other factors. For most people, a normal blood pressure is less than 120/80. Hypertension is treated with lifestyle changes, medicines, or a combination of both. Lifestyle changes include losing weight, eating a healthy,  low-sodium diet, exercising more, and limiting alcohol. This information is not intended to replace advice given to you by your health care provider. Make sure you discuss any questions you have with your health care provider. Document Revised: 01/23/2021 Document Reviewed: 01/23/2021 Elsevier Patient Education  2024 [employer].

## 2023-05-08 NOTE — Telephone Encounter (Signed)
 Spoke with patient, he is aware Dr.Jones will review and comment on his labs within 24-48 hours. Only 2 things are elevated

## 2023-05-08 NOTE — Telephone Encounter (Signed)
 Copied from CRM [id number]. Topic: Clinical - Lab/Test Results >> May 08, 2023  4:13 PM Marlan Silva wrote: Reason for CRM: Patient has some abnormal labs from today and is requesting a call back from nurse.

## 2023-05-09 ENCOUNTER — Encounter: Payer: Self-pay | Admitting: Internal Medicine

## 2023-06-13 ENCOUNTER — Other Ambulatory Visit: Payer: Self-pay | Admitting: Internal Medicine

## 2023-06-13 DIAGNOSIS — E785 Hyperlipidemia, unspecified: Secondary | ICD-10-CM

## 2023-06-13 DIAGNOSIS — I10 Essential (primary) hypertension: Secondary | ICD-10-CM

## 2023-07-24 DIAGNOSIS — H35033 Hypertensive retinopathy, bilateral: Secondary | ICD-10-CM | POA: Diagnosis not present

## 2023-07-24 DIAGNOSIS — H43811 Vitreous degeneration, right eye: Secondary | ICD-10-CM | POA: Diagnosis not present

## 2023-07-24 DIAGNOSIS — H353122 Nonexudative age-related macular degeneration, left eye, intermediate dry stage: Secondary | ICD-10-CM | POA: Diagnosis not present

## 2023-07-24 DIAGNOSIS — H353211 Exudative age-related macular degeneration, right eye, with active choroidal neovascularization: Secondary | ICD-10-CM | POA: Diagnosis not present

## 2023-07-24 DIAGNOSIS — H2513 Age-related nuclear cataract, bilateral: Secondary | ICD-10-CM | POA: Diagnosis not present

## 2023-07-24 DIAGNOSIS — H35373 Puckering of macula, bilateral: Secondary | ICD-10-CM | POA: Diagnosis not present

## 2023-07-24 DIAGNOSIS — H43822 Vitreomacular adhesion, left eye: Secondary | ICD-10-CM | POA: Diagnosis not present

## 2023-09-23 DIAGNOSIS — C44729 Squamous cell carcinoma of skin of left lower limb, including hip: Secondary | ICD-10-CM | POA: Diagnosis not present

## 2023-09-23 DIAGNOSIS — D492 Neoplasm of unspecified behavior of bone, soft tissue, and skin: Secondary | ICD-10-CM | POA: Diagnosis not present

## 2023-10-04 ENCOUNTER — Other Ambulatory Visit: Payer: Self-pay | Admitting: Internal Medicine

## 2023-10-04 DIAGNOSIS — I10 Essential (primary) hypertension: Secondary | ICD-10-CM

## 2023-10-04 DIAGNOSIS — E785 Hyperlipidemia, unspecified: Secondary | ICD-10-CM

## 2023-10-07 NOTE — Telephone Encounter (Signed)
 Copied from CRM [id number]. Topic: General - Other >> Oct 07, 2023 12:03 PM Adrionna Y wrote: Reason for CRM: Patient is calling to see where the process is with his medications  olmesartan  (BENICAR ) 20 MG , rosuvastatin  (CRESTOR ) 10 MG, amLODipine  (NORVASC ) 5 MG tablet  He is currently out and would like to get them approved

## 2023-11-06 ENCOUNTER — Ambulatory Visit: Payer: Medicare HMO | Admitting: Internal Medicine

## 2023-11-18 ENCOUNTER — Ambulatory Visit: Admitting: Internal Medicine

## 2023-11-20 DIAGNOSIS — H2513 Age-related nuclear cataract, bilateral: Secondary | ICD-10-CM | POA: Diagnosis not present

## 2023-11-20 DIAGNOSIS — H35373 Puckering of macula, bilateral: Secondary | ICD-10-CM | POA: Diagnosis not present

## 2023-11-20 DIAGNOSIS — H353122 Nonexudative age-related macular degeneration, left eye, intermediate dry stage: Secondary | ICD-10-CM | POA: Diagnosis not present

## 2023-11-20 DIAGNOSIS — H43811 Vitreous degeneration, right eye: Secondary | ICD-10-CM | POA: Diagnosis not present

## 2023-11-20 DIAGNOSIS — H353211 Exudative age-related macular degeneration, right eye, with active choroidal neovascularization: Secondary | ICD-10-CM | POA: Diagnosis not present

## 2023-11-20 DIAGNOSIS — H35033 Hypertensive retinopathy, bilateral: Secondary | ICD-10-CM | POA: Diagnosis not present

## 2023-11-20 DIAGNOSIS — H43822 Vitreomacular adhesion, left eye: Secondary | ICD-10-CM | POA: Diagnosis not present

## 2023-11-27 ENCOUNTER — Ambulatory Visit (INDEPENDENT_AMBULATORY_CARE_PROVIDER_SITE_OTHER): Admitting: Internal Medicine

## 2023-11-27 ENCOUNTER — Encounter: Payer: Self-pay | Admitting: Internal Medicine

## 2023-11-27 VITALS — BP 140/98 | HR 90 | Temp 98.0°F | Resp 16 | Ht 71.5 in | Wt 213.0 lb

## 2023-11-27 DIAGNOSIS — Z0000 Encounter for general adult medical examination without abnormal findings: Secondary | ICD-10-CM

## 2023-11-27 DIAGNOSIS — I7121 Aneurysm of the ascending aorta, without rupture: Secondary | ICD-10-CM | POA: Diagnosis not present

## 2023-11-27 DIAGNOSIS — I10 Essential (primary) hypertension: Secondary | ICD-10-CM

## 2023-11-27 DIAGNOSIS — D696 Thrombocytopenia, unspecified: Secondary | ICD-10-CM | POA: Diagnosis not present

## 2023-11-27 DIAGNOSIS — Z125 Encounter for screening for malignant neoplasm of prostate: Secondary | ICD-10-CM | POA: Diagnosis not present

## 2023-11-27 DIAGNOSIS — E785 Hyperlipidemia, unspecified: Secondary | ICD-10-CM

## 2023-11-27 DIAGNOSIS — Z0001 Encounter for general adult medical examination with abnormal findings: Secondary | ICD-10-CM | POA: Insufficient documentation

## 2023-11-27 LAB — BASIC METABOLIC PANEL WITH GFR
BUN: 16 mg/dL (ref 6–23)
CO2: 26 meq/L (ref 19–32)
Calcium: 9.1 mg/dL (ref 8.4–10.5)
Chloride: 105 meq/L (ref 96–112)
Creatinine, Ser: 1.1 mg/dL (ref 0.40–1.50)
GFR: 69.92 mL/min (ref >60.00)
Glucose, Bld: 106 mg/dL — ABNORMAL HIGH (ref 70–99)
Potassium: 4.4 meq/L (ref 3.5–5.1)
Sodium: 142 meq/L (ref 135–145)

## 2023-11-27 LAB — CBC WITH DIFFERENTIAL/PLATELET
Basophils Absolute: 0 K/uL (ref 0.0–0.1)
Basophils Relative: 0.5 % (ref 0.0–3.0)
Eosinophils Absolute: 0.2 K/uL (ref 0.0–0.7)
Eosinophils Relative: 4.1 % (ref 0.0–5.0)
HCT: 43.1 % (ref 39.0–52.0)
Hemoglobin: 14.6 g/dL (ref 13.0–17.0)
Lymphocytes Relative: 33.6 % (ref 12.0–46.0)
Lymphs Abs: 1.4 K/uL (ref 0.7–4.0)
MCHC: 34 g/dL (ref 30.0–36.0)
MCV: 91.8 fl (ref 78.0–100.0)
Monocytes Absolute: 0.4 K/uL (ref 0.1–1.0)
Monocytes Relative: 10 % (ref 3.0–12.0)
Neutro Abs: 2.1 K/uL (ref 1.4–7.7)
Neutrophils Relative %: 51.8 % (ref 43.0–77.0)
Platelets: 139 K/uL — ABNORMAL LOW (ref 150.0–400.0)
RBC: 4.69 Mil/uL (ref 4.22–5.81)
RDW: 13.3 % (ref 11.5–15.5)
WBC: 4.1 K/uL (ref 4.0–10.5)

## 2023-11-27 LAB — URINALYSIS, ROUTINE W REFLEX MICROSCOPIC
Bilirubin Urine: NEGATIVE
Hgb urine dipstick: NEGATIVE
Ketones, ur: NEGATIVE
Leukocytes,Ua: NEGATIVE
Nitrite: NEGATIVE
RBC / HPF: NONE SEEN (ref 0–?)
Specific Gravity, Urine: <=1.005 — AB (ref 1.000–1.030)
Total Protein, Urine: NEGATIVE
Urine Glucose: NEGATIVE
Urobilinogen, UA: 0.2 (ref 0.0–1.0)
pH: 6 (ref 5.0–8.0)

## 2023-11-27 LAB — HEPATIC FUNCTION PANEL
ALT: 20 U/L (ref 0–53)
AST: 15 U/L (ref 0–37)
Albumin: 4.6 g/dL (ref 3.5–5.2)
Alkaline Phosphatase: 90 U/L (ref 39–117)
Bilirubin, Direct: 0.2 mg/dL (ref 0.0–0.3)
Total Bilirubin: 0.9 mg/dL (ref 0.2–1.2)
Total Protein: 6.7 g/dL (ref 6.0–8.3)

## 2023-11-27 LAB — PSA: PSA: 1.01 ng/mL (ref 0.10–4.00)

## 2023-11-27 LAB — TSH: TSH: 0.93 u[IU]/mL (ref 0.35–5.50)

## 2023-11-27 LAB — LIPID PANEL
Cholesterol: 137 mg/dL (ref 0–200)
HDL: 67.1 mg/dL (ref >39.00)
LDL Cholesterol: 49 mg/dL (ref 0–99)
NonHDL: 69.48
Total CHOL/HDL Ratio: 2
Triglycerides: 103 mg/dL (ref 0.0–149.0)
VLDL: 20.6 mg/dL (ref 0.0–40.0)

## 2023-11-27 NOTE — Progress Notes (Unsigned)
 Subjective:  Patient ID: Jake [name], male    DOB: 1955-12-03  Age: 68 y.o. MRN: [id number]  CC: Annual Exam, Hypertension, and Hyperlipidemia   HPI Jake [name] presents for a CPX and f/up ----  Discussed the use of AI scribe software for clinical note transcription with the patient, who gave verbal consent to proceed.  History of Present Illness Jake [name] a 68 year old male who presents for a routine follow-up visit.  Jake [name] feels great when exercising and has been attending [employer] since March or April, although not consistently. Jake [name] enjoys the endorphin release from exercising and feels more relaxed and happy. No chest pain, shortness of breath, dizziness, or lightheadedness.  Jake [name] had an eye injection last week and noticed a couple of floaters, which have since resolved.  Jake [name] saw a dermatologist who recommended a procedure for a skin condition, which Jake [name] attributes to a sunburn in March. Jake [name] describes Jake [name] skin as thin and sensitive.  Jake [name] often forgets to take Jake [name] blood pressure and allergy medications, skipping them every other day. Despite this, Jake [name] feels fine and attributes Jake [name] forgetfulness to Jake [name] morning routine of having coffee and going to the gym. No blood pressure symptoms, headaches, stomach issues, abdominal pain, nausea, or painful urination. Jake [name] feels great overall.  Jake [name] has reduced Jake [name] alcohol consumption, having only half a fifth of vodka over the past month, and feels better for it. Jake [name] occasionally has a cocktail with a friend.  Jake [name] had an ear issue that was resolved by a PA neighbor who performed a procedure that cleared Jake [name] ear the next day. Jake [name] was initially nervous about flying due to the ear problem.  Jake [name] feels more relaxed now compared to when Jake [name] had kidney issues, which have since been resolved.     Outpatient Medications Prior to Visit  Medication Sig Dispense Refill   rosuvastatin  (CRESTOR ) 10 MG tablet TAKE 1 TABLET EVERY DAY 90 tablet 3   amLODipine   (NORVASC ) 5 MG tablet TAKE 1 TABLET EVERY DAY 90 tablet 3   olmesartan  (BENICAR ) 20 MG tablet TAKE 1 TABLET EVERY DAY 90 tablet 3   No facility-administered medications prior to visit.    ROS Review of Systems  Constitutional:  Negative for appetite change, chills, diaphoresis, fatigue and fever.  HENT: Negative.    Eyes: Negative.   Respiratory: Negative.  Negative for apnea, cough, shortness of breath and wheezing.   Cardiovascular:  Negative for chest pain, palpitations and leg swelling.  Gastrointestinal: Negative.  Negative for abdominal pain, constipation, diarrhea, nausea and vomiting.  Genitourinary: Negative.  Negative for difficulty urinating, dysuria and hematuria.  Musculoskeletal: Negative.  Negative for arthralgias and myalgias.  Skin: Negative.   Neurological: Negative.  Negative for dizziness, weakness, light-headedness and headaches.  Hematological:  Negative for adenopathy. Does not bruise/bleed easily.  Psychiatric/Behavioral: Negative.      Objective:  BP (!) 140/98 (BP Location: Left Arm, Patient Position: Sitting, Cuff Size: Normal)   Pulse 90   Temp 98 F (36.7 C) (Oral)   Resp 16   Ht 5' 11.5 (1.816 m)   Wt 213 lb (96.6 kg)   SpO2 98%   BMI 29.29 kg/m   BP Readings from Last 3 Encounters:  11/27/23 (!) 140/98  05/08/23 134/82  04/24/23 132/88    Wt Readings from Last 3 Encounters:  11/27/23 213 lb (96.6 kg)  05/08/23 214 lb 9.6 oz (97.3 kg)  04/24/23 215 lb 12.8 oz (97.9 kg)  Physical Exam Vitals reviewed.  Constitutional:      Appearance: Normal appearance.  HENT:     Nose: Nose normal.     Mouth/Throat:     Mouth: Mucous membranes are moist.  Eyes:     General: No scleral icterus.    Conjunctiva/sclera: Conjunctivae normal.  Cardiovascular:     Rate and Rhythm: Normal rate and regular rhythm.     Heart sounds: No murmur heard.    No friction rub. No gallop.     Comments: EKG--- NSR, 92 bpm No LVH, Q waves, or ST/T wave  changes  Pulmonary:     Effort: Pulmonary effort [name] normal.     Breath sounds: No stridor. No wheezing, rhonchi or rales.  Abdominal:     General: Abdomen [name] flat.     Palpations: There [name] no mass.     Tenderness: There [name] no abdominal tenderness. There [name] no guarding.     Hernia: No hernia [name] present.  Genitourinary:    Comments: GU/rectal deferred at Jake [name] request Musculoskeletal:        General: No swelling.     Cervical back: Neck supple.     Right lower leg: No edema.     Left lower leg: No edema.  Lymphadenopathy:     Cervical: No cervical adenopathy.  Skin:    General: Skin [name] warm.  Neurological:     General: No focal deficit present.     Mental Status: Jake [name] [name] alert. Mental status [name] at baseline.  Psychiatric:        Mood and Affect: Mood normal.        Behavior: Behavior normal.     Lab Results  Component Value Date   WBC 4.1 11/27/2023   HGB 14.6 11/27/2023   HCT 43.1 11/27/2023   PLT 139.0 (L) 11/27/2023   GLUCOSE 106 (H) 11/27/2023   CHOL 137 11/27/2023   TRIG 103.0 11/27/2023   HDL 67.10 11/27/2023   LDLCALC 49 11/27/2023   ALT 20 11/27/2023   AST 15 11/27/2023   NA 142 11/27/2023   K 4.4 11/27/2023   CL 105 11/27/2023   CREATININE 1.10 11/27/2023   BUN 16 11/27/2023   CO2 26 11/27/2023   TSH 0.93 11/27/2023   PSA 1.01 11/27/2023    CT CARDIAC SCORING ([facility] LOCATIONS ONLY) Result Date: 01/08/2023 CLINICAL DATA:  68 year old Caucasian male with history of dyslipidemia and hypertension. * Tracking Code: FCC * EXAM: CT CARDIAC CORONARY ARTERY CALCIUM  SCORE TECHNIQUE: Non-contrast imaging through the heart was performed using prospective ECG gating. Image post processing was performed on an independent workstation, allowing for quantitative analysis of the heart and coronary arteries. Note that this exam targets the heart and the chest was not imaged in its entirety. COMPARISON:  No priors. FINDINGS: CORONARY CALCIUM  SCORES: Left Main: 0 LAD: 12 LCx: 0  RCA/PDA: 74 Total Agatston Score: 86 MESA database percentile: 52nd AORTA MEASUREMENTS: Ascending Aorta: 4.4 cm Descending Aorta:2.6 cm OTHER FINDINGS: Within the visualized portions of the thorax there are no suspicious appearing pulmonary nodules or masses, there [name] no acute consolidative airspace disease, no pleural effusions, no pneumothorax and no lymphadenopathy. Visualized portions of the upper abdomen are unremarkable. There are no aggressive appearing lytic or blastic lesions noted in the visualized portions of the skeleton. IMPRESSION: 1. Patient's total coronary artery calcium  score [name] 86 which [name] 52nd percentile for patient's of matched age, gender and race/ethnicity. Please note that although the presence of coronary artery calcium  documents  the presence of coronary artery disease, the severity of this disease and any potential stenosis cannot be assessed on this noncontrast CT examination. Assessment for potential risk factor modification, dietary therapy or pharmacologic therapy may be warranted, if clinically indicated. 2. No significant incidental noncardiac findings are noted. Electronically Signed   By: Toribio Aye M.D.   On: 01/08/2023 14:48    Assessment & Plan:   Dyslipidemia, goal LDL below 100- LDL goal achieved. Doing well on the statin  -     Lipid panel; Future -     TSH; Future -     Hepatic function panel; Future  Essential hypertension, benign- Will try to achieve better BP control. -     TSH; Future -     Hepatic function panel; Future -     CBC with Differential/Platelet; Future -     Basic metabolic panel with GFR; Future -     EKG 12-Lead -     Urinalysis, Routine w reflex microscopic; Future -     amLODIPine  Besylate; Take 1 tablet (5 mg total) by mouth daily.  Dispense: 90 tablet; Refill: 1 -     Olmesartan  Medoxomil; Take 1 tablet (20 mg total) by mouth daily.  Dispense: 90 tablet; Refill: 1 -     AMB Referral [facility] Care Management  Prostate cancer  screening -     PSA; Future  Encounter for general adult medical examination with abnormal findings- Exam completed, labs reviewed, vaccines reviewed, cancer screenings addressed, pt ed material was given.   Aneurysm of ascending aorta without rupture (HCC) -     MR ANGIO CHEST W WO CONTRAST; Future  Thrombocytopenia (HCC)- No B/B.     Follow-up: Return in about 6 months (around 05/29/2024).  Debby Molt, MD

## 2023-11-27 NOTE — Patient Instructions (Signed)
 Health Maintenance, Male  Adopting a healthy lifestyle and getting preventive care are important in promoting health and wellness. Ask your health care provider about:  The right schedule for you to have regular tests and exams.  Things you can do on your own to prevent diseases and keep yourself healthy.  What should I know about diet, weight, and exercise?  Eat a healthy diet    Eat a diet that includes plenty of vegetables, fruits, low-fat dairy products, and lean protein.  Do not eat a lot of foods that are high in solid fats, added sugars, or sodium.  Maintain a healthy weight  Body mass index (BMI) is a measurement that can be used to identify possible weight problems. It estimates body fat based on height and weight. Your health care provider can help determine your BMI and help you achieve or maintain a healthy weight.  Get regular exercise  Get regular exercise. This is one of the most important things you can do for your health. Most adults should:  Exercise for at least 150 minutes each week. The exercise should increase your heart rate and make you sweat (moderate-intensity exercise).  Do strengthening exercises at least twice a week. This is in addition to the moderate-intensity exercise.  Spend less time sitting. Even light physical activity can be beneficial.  Watch cholesterol and blood lipids  Have your blood tested for lipids and cholesterol at 66 years of age, then have this test every 5 years.  You may need to have your cholesterol levels checked more often if:  Your lipid or cholesterol levels are high.  You are older than 66 years of age.  You are at high risk for heart disease.  What should I know about cancer screening?  Many types of cancers can be detected early and may often be prevented. Depending on your health history and family history, you may need to have cancer screening at various ages. This may include screening for:  Colorectal cancer.  Prostate cancer.  Skin cancer.  Lung  cancer.  What should I know about heart disease, diabetes, and high blood pressure?  Blood pressure and heart disease  High blood pressure causes heart disease and increases the risk of stroke. This is more likely to develop in people who have high blood pressure readings or are overweight.  Talk with your health care provider about your target blood pressure readings.  Have your blood pressure checked:  Every 3-5 years if you are 24-52 years of age.  Every year if you are 3 years old or older.  If you are between the ages of 60 and 72 and are a current or former smoker, ask your health care provider if you should have a one-time screening for abdominal aortic aneurysm (AAA).  Diabetes  Have regular diabetes screenings. This checks your fasting blood sugar level. Have the screening done:  Once every three years after age 66 if you are at a normal weight and have a low risk for diabetes.  More often and at a younger age if you are overweight or have a high risk for diabetes.  What should I know about preventing infection?  Hepatitis B  If you have a higher risk for hepatitis B, you should be screened for this virus. Talk with your health care provider to find out if you are at risk for hepatitis B infection.  Hepatitis C  Blood testing is recommended for:  Everyone born from [birth year] through 1963.  Anyone  with known risk factors for hepatitis C.  Sexually transmitted infections (STIs)  You should be screened each year for STIs, including gonorrhea and chlamydia, if:  You are sexually active and are younger than 66 years of age.  You are older than 66 years of age and your health care provider tells you that you are at risk for this type of infection.  Your sexual activity has changed since you were last screened, and you are at increased risk for chlamydia or gonorrhea. Ask your health care provider if you are at risk.  Ask your health care provider about whether you are at high risk for HIV. Your health care provider  may recommend a prescription medicine to help prevent HIV infection. If you choose to take medicine to prevent HIV, you should first get tested for HIV. You should then be tested every 3 months for as long as you are taking the medicine.  Follow these instructions at home:  Alcohol use  Do not drink alcohol if your health care provider tells you not to drink.  If you drink alcohol:  Limit how much you have to 0-2 drinks a day.  Know how much alcohol is in your drink. In the [place] one drink equals one 12 oz bottle of beer (355 mL), one 5 oz glass of wine (148 mL), or one 1 oz glass of hard liquor (44 mL).  Lifestyle  Do not use any products that contain nicotine or tobacco. These products include cigarettes, chewing tobacco, and vaping devices, such as e-cigarettes. If you need help quitting, ask your health care provider.  Do not use street drugs.  Do not share needles.  Ask your health care provider for help if you need support or information about quitting drugs.  General instructions  Schedule regular health, dental, and eye exams.  Stay current with your vaccines.  Tell your health care provider if:  You often feel depressed.  You have ever been abused or do not feel safe at home.  Summary  Adopting a healthy lifestyle and getting preventive care are important in promoting health and wellness.  Follow your health care provider's instructions about healthy diet, exercising, and getting tested or screened for diseases.  Follow your health care provider's instructions on monitoring your cholesterol and blood pressure.  This information is not intended to replace advice given to you by your health care provider. Make sure you discuss any questions you have with your health care provider.  Document Revised: 08/07/2020 Document Reviewed: 08/07/2020  Elsevier Patient Education  2024 [employer].

## 2023-11-28 ENCOUNTER — Ambulatory Visit: Payer: Self-pay | Admitting: Internal Medicine

## 2023-11-28 MED ORDER — OLMESARTAN MEDOXOMIL 20 MG PO TABS: 20.0000 mg | ORAL_TABLET | Freq: Every day | ORAL | 1 refills | Status: AC

## 2023-11-28 MED ORDER — AMLODIPINE BESYLATE 5 MG PO TABS: 5.0000 mg | ORAL_TABLET | Freq: Every day | ORAL | 1 refills | Status: AC

## 2023-12-11 ENCOUNTER — Telehealth: Payer: Self-pay

## 2023-12-11 ENCOUNTER — Telehealth: Payer: Self-pay | Admitting: *Deleted

## 2023-12-11 DIAGNOSIS — Z85820 Personal history of malignant melanoma of skin: Secondary | ICD-10-CM | POA: Diagnosis not present

## 2023-12-11 DIAGNOSIS — L570 Actinic keratosis: Secondary | ICD-10-CM | POA: Diagnosis not present

## 2023-12-11 DIAGNOSIS — L814 Other melanin hyperpigmentation: Secondary | ICD-10-CM | POA: Diagnosis not present

## 2023-12-11 DIAGNOSIS — Z85828 Personal history of other malignant neoplasm of skin: Secondary | ICD-10-CM | POA: Diagnosis not present

## 2023-12-11 DIAGNOSIS — Z08 Encounter for follow-up examination after completed treatment for malignant neoplasm: Secondary | ICD-10-CM | POA: Diagnosis not present

## 2023-12-11 DIAGNOSIS — L821 Other seborrheic keratosis: Secondary | ICD-10-CM | POA: Diagnosis not present

## 2023-12-11 NOTE — Telephone Encounter (Signed)
 Copied from CRM #[id number]. Topic: Clinical - Medication Question >> Dec 11, 2023  2:50 PM Turkey A wrote: Reason for CRM: Patient called and would like the prescription for Covid Vaccine-

## 2023-12-11 NOTE — Progress Notes (Signed)
 Care Guide Pharmacy Note  12/11/2023 Name: Jake [name] MRN: [id number] DOB: 1955-08-22  Referred By: Joshua Debby CROME, MD Reason for referral: Call Attempt #1 and Complex Care Management (Outreach to schedule referral with pharmacist )   Jake [name] is a 68 y.o. year old male who is a primary care patient of Joshua Debby CROME, MD.  Jake [name] was referred to the pharmacist for assistance related to: HTN  An unsuccessful telephone outreach was attempted today to contact the patient who was referred to the pharmacy team for assistance with medication management. Additional attempts will be made to contact the patient.  Jake [name], CMA [place]  [facility], [facility] Guide Direct Dial: [phone]  Fax: [phone] Website: [email or web address]

## 2023-12-12 ENCOUNTER — Other Ambulatory Visit: Payer: Self-pay

## 2023-12-12 DIAGNOSIS — Z23 Encounter for immunization: Secondary | ICD-10-CM

## 2023-12-12 MED ORDER — COVID-19 MRNA VAC-TRIS(PFIZER) 30 MCG/0.3ML IM SUSY: 0.3000 mL | PREFILLED_SYRINGE | Freq: Once | INTRAMUSCULAR | 0 refills | Status: AC

## 2023-12-12 NOTE — Telephone Encounter (Signed)
 RX has been sent and patient has been made aware

## 2023-12-12 NOTE — Progress Notes (Signed)
 Care Guide Pharmacy Note  12/12/2023 Name: Jake [name] MRN: [id number] DOB: [birth year]  Referred By: Joshua Debby CROME, MD Reason for referral: Call Attempt #1 and Complex Care Management (Outreach to schedule referral with pharmacist )   Jake [name] is a 68 y.o. year old male who is a primary care patient of Joshua Debby CROME, MD.  Jake [name] was referred to the pharmacist for assistance related to: HTN  A second unsuccessful telephone outreach was attempted today to contact the patient who was referred to the pharmacy team for assistance with medication management. Additional attempts will be made to contact the patient.  Jake [name], CMA [place]  Jake [name], [facility] Guide Direct Dial: [phone]  Fax: [phone] Website: [email or web address]

## 2023-12-15 NOTE — Progress Notes (Signed)
 Care Guide Pharmacy Note  12/15/2023 Name: Shloime Keilman MRN: [id number] DOB: Jun 18, 1955  Referred By: Joshua Debby CROME, MD Reason for referral: Call Attempt #1 and Complex Care Management (Outreach to schedule referral with pharmacist )   Norleen Oneil Beever is a 68 y.o. year old male who is a primary care patient of Joshua Debby CROME, MD.  Norleen Oneil Donoso was referred to the pharmacist for assistance related to: HTN  A third unsuccessful telephone outreach was attempted today to contact the patient who was referred to the pharmacy team for assistance with medication management. The Population Health team is pleased to engage with this patient at any time in the future upon receipt of referral and should he/she be interested in assistance from the [facility] team.  Thedford Franks, CMA, Care Guide [facility]  [facility] Guide Direct Dial: [phone]  Fax: [phone] Website: [email or web address]

## 2023-12-22 DIAGNOSIS — C44729 Squamous cell carcinoma of skin of left lower limb, including hip: Secondary | ICD-10-CM | POA: Diagnosis not present

## 2023-12-26 DIAGNOSIS — Z0100 Encounter for examination of eyes and vision without abnormal findings: Secondary | ICD-10-CM | POA: Diagnosis not present

## 2023-12-26 DIAGNOSIS — H52223 Regular astigmatism, bilateral: Secondary | ICD-10-CM | POA: Diagnosis not present

## 2024-01-01 DIAGNOSIS — H6122 Impacted cerumen, left ear: Secondary | ICD-10-CM | POA: Diagnosis not present

## 2024-01-01 DIAGNOSIS — H6240 Otitis externa in other diseases classified elsewhere, unspecified ear: Secondary | ICD-10-CM | POA: Diagnosis not present

## 2024-01-01 DIAGNOSIS — B369 Superficial mycosis, unspecified: Secondary | ICD-10-CM | POA: Diagnosis not present

## 2024-01-01 DIAGNOSIS — J3489 Other specified disorders of nose and nasal sinuses: Secondary | ICD-10-CM | POA: Diagnosis not present

## 2024-02-20 DIAGNOSIS — H35033 Hypertensive retinopathy, bilateral: Secondary | ICD-10-CM | POA: Diagnosis not present

## 2024-02-20 DIAGNOSIS — H353211 Exudative age-related macular degeneration, right eye, with active choroidal neovascularization: Secondary | ICD-10-CM | POA: Diagnosis not present

## 2024-02-20 DIAGNOSIS — H2513 Age-related nuclear cataract, bilateral: Secondary | ICD-10-CM | POA: Diagnosis not present

## 2024-02-20 DIAGNOSIS — H43811 Vitreous degeneration, right eye: Secondary | ICD-10-CM | POA: Diagnosis not present

## 2024-02-20 DIAGNOSIS — H43822 Vitreomacular adhesion, left eye: Secondary | ICD-10-CM | POA: Diagnosis not present

## 2024-02-20 DIAGNOSIS — H353122 Nonexudative age-related macular degeneration, left eye, intermediate dry stage: Secondary | ICD-10-CM | POA: Diagnosis not present

## 2024-02-20 DIAGNOSIS — H35373 Puckering of macula, bilateral: Secondary | ICD-10-CM | POA: Diagnosis not present

## 2024-04-29 ENCOUNTER — Ambulatory Visit: Payer: Medicare HMO

## 2024-05-31 ENCOUNTER — Ambulatory Visit: Admitting: Internal Medicine

## 2024-06-08 ENCOUNTER — Ambulatory Visit: Admitting: Internal Medicine
